# Patient Record
Sex: Female | Born: 1955 | Race: White | Hispanic: No | Marital: Married | Smoking: Never smoker
Health system: Southern US, Community
[De-identification: ages and names within clinical notes are randomized; demographics above are authoritative.]

## PROBLEM LIST (undated history)

## (undated) DIAGNOSIS — I10 Essential (primary) hypertension: Secondary | ICD-10-CM

## (undated) DIAGNOSIS — E039 Hypothyroidism, unspecified: Secondary | ICD-10-CM

## (undated) HISTORY — PX: LUMBAR LAMINECTOMY: SHX95

---

## 2002-07-11 ENCOUNTER — Encounter: Admission: RE | Admit: 2002-07-11 | Discharge: 2002-07-11 | Payer: Self-pay | Admitting: Internal Medicine

## 2002-07-11 ENCOUNTER — Encounter: Payer: Self-pay | Admitting: Internal Medicine

## 2002-07-30 ENCOUNTER — Encounter: Admission: RE | Admit: 2002-07-30 | Discharge: 2002-07-30 | Payer: Self-pay | Admitting: Internal Medicine

## 2002-07-30 ENCOUNTER — Encounter: Payer: Self-pay | Admitting: Internal Medicine

## 2003-08-20 ENCOUNTER — Other Ambulatory Visit: Admission: RE | Admit: 2003-08-20 | Discharge: 2003-08-20 | Payer: Self-pay | Admitting: Obstetrics and Gynecology

## 2003-09-24 ENCOUNTER — Encounter: Admission: RE | Admit: 2003-09-24 | Discharge: 2003-09-24 | Payer: Self-pay | Admitting: Obstetrics and Gynecology

## 2004-09-24 ENCOUNTER — Encounter: Admission: RE | Admit: 2004-09-24 | Discharge: 2004-09-24 | Payer: Self-pay | Admitting: Obstetrics and Gynecology

## 2006-05-01 ENCOUNTER — Encounter: Admission: RE | Admit: 2006-05-01 | Discharge: 2006-05-01 | Payer: Self-pay | Admitting: Obstetrics and Gynecology

## 2006-05-10 ENCOUNTER — Encounter: Admission: RE | Admit: 2006-05-10 | Discharge: 2006-05-10 | Payer: Self-pay | Admitting: Obstetrics and Gynecology

## 2006-06-13 ENCOUNTER — Ambulatory Visit: Payer: Self-pay | Admitting: Gastroenterology

## 2006-06-23 ENCOUNTER — Ambulatory Visit: Payer: Self-pay | Admitting: Gastroenterology

## 2006-06-23 ENCOUNTER — Encounter: Payer: Self-pay | Admitting: Gastroenterology

## 2007-05-07 ENCOUNTER — Encounter: Admission: RE | Admit: 2007-05-07 | Discharge: 2007-05-07 | Payer: Self-pay | Admitting: Obstetrics and Gynecology

## 2007-07-06 ENCOUNTER — Ambulatory Visit: Payer: Self-pay | Admitting: Internal Medicine

## 2007-07-06 DIAGNOSIS — E039 Hypothyroidism, unspecified: Secondary | ICD-10-CM | POA: Insufficient documentation

## 2007-07-06 DIAGNOSIS — N951 Menopausal and female climacteric states: Secondary | ICD-10-CM | POA: Insufficient documentation

## 2007-07-06 DIAGNOSIS — J309 Allergic rhinitis, unspecified: Secondary | ICD-10-CM | POA: Insufficient documentation

## 2007-07-06 DIAGNOSIS — R635 Abnormal weight gain: Secondary | ICD-10-CM | POA: Insufficient documentation

## 2007-07-06 DIAGNOSIS — I10 Essential (primary) hypertension: Secondary | ICD-10-CM | POA: Insufficient documentation

## 2007-07-13 ENCOUNTER — Ambulatory Visit: Payer: Self-pay | Admitting: Internal Medicine

## 2007-07-15 LAB — CONVERTED CEMR LAB
ALT: 23 U/L
AST: 24 U/L
Albumin: 4.2 g/dL
Alkaline Phosphatase: 44 U/L
BUN: 15 mg/dL
Basophils Absolute: 0 K/uL
Basophils Relative: 0.3 %
Bilirubin, Direct: 0.2 mg/dL
CO2: 28 meq/L
Calcium: 10.2 mg/dL
Chloride: 105 meq/L
Cholesterol: 218 mg/dL
Creatinine, Ser: 0.7 mg/dL
Direct LDL: 144.3 mg/dL
Eosinophils Absolute: 0.1 K/uL
Eosinophils Relative: 1.8 %
GFR calc Af Amer: 113 mL/min
GFR calc non Af Amer: 94 mL/min
Glucose, Bld: 92 mg/dL
HCT: 37.7 %
HDL: 42.5 mg/dL
Hemoglobin: 13.5 g/dL
Lymphocytes Relative: 35.3 %
MCHC: 35.7 g/dL
MCV: 93.6 fL
Monocytes Absolute: 0.5 K/uL
Monocytes Relative: 8.7 %
Neutro Abs: 3.5 K/uL
Neutrophils Relative %: 53.9 %
Platelets: 285 K/uL
Potassium: 4.4 meq/L
RBC: 4.03 M/uL
RDW: 11.5 %
Sodium: 141 meq/L
TSH: 5.71 u[IU]/mL — ABNORMAL HIGH
Total Bilirubin: 1.1 mg/dL
Total CHOL/HDL Ratio: 5.1
Total Protein: 7.1 g/dL
Triglycerides: 140 mg/dL
VLDL: 28 mg/dL
WBC: 6.3 10*3/microliter

## 2007-07-16 ENCOUNTER — Encounter: Payer: Self-pay | Admitting: Internal Medicine

## 2007-07-18 ENCOUNTER — Ambulatory Visit: Payer: Self-pay | Admitting: Internal Medicine

## 2007-07-18 DIAGNOSIS — E782 Mixed hyperlipidemia: Secondary | ICD-10-CM | POA: Insufficient documentation

## 2007-07-18 LAB — CONVERTED CEMR LAB
Cholesterol, target level: 200 mg/dL
HDL goal, serum: 40 mg/dL

## 2007-08-01 ENCOUNTER — Inpatient Hospital Stay (HOSPITAL_COMMUNITY): Admission: EM | Admit: 2007-08-01 | Discharge: 2007-08-04 | Payer: Self-pay | Admitting: Emergency Medicine

## 2007-10-31 ENCOUNTER — Ambulatory Visit: Payer: Self-pay | Admitting: Internal Medicine

## 2007-11-01 LAB — CONVERTED CEMR LAB
BUN: 12 mg/dL (ref 6–23)
Chloride: 100 meq/L (ref 96–112)
Creatinine, Ser: 0.7 mg/dL (ref 0.4–1.2)
Glucose, Bld: 87 mg/dL (ref 70–99)
Potassium: 4 meq/L (ref 3.5–5.1)
Sodium: 137 meq/L (ref 135–145)
TSH: 3.29 microintl units/mL (ref 0.35–5.50)

## 2007-11-20 ENCOUNTER — Encounter: Payer: Self-pay | Admitting: Internal Medicine

## 2007-11-21 ENCOUNTER — Ambulatory Visit: Payer: Self-pay | Admitting: Internal Medicine

## 2007-12-05 ENCOUNTER — Encounter: Admission: RE | Admit: 2007-12-05 | Discharge: 2008-02-12 | Payer: Self-pay | Admitting: Physician Assistant

## 2008-01-28 ENCOUNTER — Telehealth: Payer: Self-pay | Admitting: *Deleted

## 2008-02-23 ENCOUNTER — Ambulatory Visit: Payer: Self-pay | Admitting: Family Medicine

## 2008-02-23 DIAGNOSIS — S335XXA Sprain of ligaments of lumbar spine, initial encounter: Secondary | ICD-10-CM

## 2008-04-23 ENCOUNTER — Ambulatory Visit: Payer: Self-pay | Admitting: Internal Medicine

## 2008-04-23 LAB — CONVERTED CEMR LAB
ALT: 38 units/L — ABNORMAL HIGH (ref 0–35)
Albumin: 3.9 g/dL (ref 3.5–5.2)
Bilirubin, Direct: 0.1 mg/dL (ref 0.0–0.3)
Calcium: 9.6 mg/dL (ref 8.4–10.5)
Eosinophils Absolute: 0.1 10*3/uL (ref 0.0–0.7)
Eosinophils Relative: 1.6 % (ref 0.0–5.0)
GFR calc Af Amer: 97 mL/min
GFR calc non Af Amer: 80 mL/min
Hemoglobin: 13.6 g/dL (ref 12.0–15.0)
LDL Cholesterol: 97 mg/dL (ref 0–99)
Lymphocytes Relative: 34.8 % (ref 12.0–46.0)
MCHC: 35.5 g/dL (ref 30.0–36.0)
MCV: 96.1 fL (ref 78.0–100.0)
Monocytes Absolute: 0.5 10*3/uL (ref 0.1–1.0)
Monocytes Relative: 9.8 % (ref 3.0–12.0)
Neutrophils Relative %: 53.6 % (ref 43.0–77.0)
Nitrite: POSITIVE
RDW: 11.6 % (ref 11.5–14.6)
Specific Gravity, Urine: 1.015
TSH: 5.59 microintl units/mL — ABNORMAL HIGH (ref 0.35–5.50)
Total Bilirubin: 1 mg/dL (ref 0.3–1.2)
WBC Urine, dipstick: NEGATIVE
WBC: 5.3 10*3/uL (ref 4.5–10.5)
pH: 7

## 2008-05-05 ENCOUNTER — Ambulatory Visit: Payer: Self-pay | Admitting: Internal Medicine

## 2008-06-13 ENCOUNTER — Encounter: Admission: RE | Admit: 2008-06-13 | Discharge: 2008-06-13 | Payer: Self-pay | Admitting: Obstetrics and Gynecology

## 2008-07-21 ENCOUNTER — Ambulatory Visit: Payer: Self-pay | Admitting: Internal Medicine

## 2008-08-15 ENCOUNTER — Encounter: Payer: Self-pay | Admitting: Internal Medicine

## 2008-08-26 ENCOUNTER — Ambulatory Visit: Payer: Self-pay | Admitting: Internal Medicine
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
## 2009-03-19 ENCOUNTER — Telehealth: Payer: Self-pay | Admitting: Internal Medicine

## 2009-04-15 ENCOUNTER — Ambulatory Visit: Payer: Self-pay | Admitting: Internal Medicine

## 2009-04-15 DIAGNOSIS — IMO0002 Reserved for concepts with insufficient information to code with codable children: Secondary | ICD-10-CM

## 2009-04-24 ENCOUNTER — Ambulatory Visit: Payer: Self-pay | Admitting: Internal Medicine

## 2009-04-24 DIAGNOSIS — H9209 Otalgia, unspecified ear: Secondary | ICD-10-CM | POA: Insufficient documentation

## 2009-05-15 ENCOUNTER — Ambulatory Visit: Payer: Self-pay | Admitting: Internal Medicine

## 2009-05-26 ENCOUNTER — Encounter (INDEPENDENT_AMBULATORY_CARE_PROVIDER_SITE_OTHER): Payer: Self-pay | Admitting: *Deleted

## 2009-06-10 ENCOUNTER — Telehealth: Payer: Self-pay | Admitting: *Deleted

## 2009-06-17 ENCOUNTER — Encounter: Admission: RE | Admit: 2009-06-17 | Discharge: 2009-06-17 | Payer: Self-pay | Admitting: Obstetrics and Gynecology

## 2009-11-06 ENCOUNTER — Telehealth: Payer: Self-pay | Admitting: Gastroenterology

## 2009-11-11 ENCOUNTER — Encounter: Payer: Self-pay | Admitting: Gastroenterology

## 2009-12-02 ENCOUNTER — Encounter (INDEPENDENT_AMBULATORY_CARE_PROVIDER_SITE_OTHER): Payer: Self-pay | Admitting: *Deleted

## 2009-12-03 ENCOUNTER — Ambulatory Visit: Payer: Self-pay | Admitting: Gastroenterology

## 2009-12-07 ENCOUNTER — Ambulatory Visit: Payer: Self-pay | Admitting: Gastroenterology

## 2009-12-09 ENCOUNTER — Encounter: Payer: Self-pay | Admitting: Gastroenterology

## 2010-04-09 ENCOUNTER — Ambulatory Visit: Payer: Self-pay | Admitting: Internal Medicine

## 2010-04-09 DIAGNOSIS — M549 Dorsalgia, unspecified: Secondary | ICD-10-CM | POA: Insufficient documentation

## 2010-04-22 ENCOUNTER — Ambulatory Visit: Payer: Self-pay | Admitting: Internal Medicine

## 2010-04-22 LAB — CONVERTED CEMR LAB
Albumin: 4.1 g/dL (ref 3.5–5.2)
Basophils Relative: 0.3 % (ref 0.0–3.0)
Bilirubin, Direct: 0.1 mg/dL (ref 0.0–0.3)
CO2: 25 meq/L (ref 19–32)
Calcium: 9.6 mg/dL (ref 8.4–10.5)
Chloride: 108 meq/L (ref 96–112)
GFR calc non Af Amer: 110.52 mL/min (ref >60)
HDL: 41.5 mg/dL (ref >39.00)
Hemoglobin, Urine: NEGATIVE
Hemoglobin: 13.9 g/dL (ref 12.0–15.0)
Lymphocytes Relative: 40.6 % (ref 12.0–46.0)
Lymphs Abs: 2 10*3/uL (ref 0.7–4.0)
Neutro Abs: 2.2 10*3/uL (ref 1.4–7.7)
Platelets: 266 10*3/uL (ref 150.0–400.0)
Sodium: 141 meq/L (ref 135–145)
Specific Gravity, Urine: 1.025 (ref 1.000–1.030)
TSH: 0.32 microintl units/mL — ABNORMAL LOW (ref 0.35–5.50)
Total CHOL/HDL Ratio: 5
Total Protein, Urine: NEGATIVE mg/dL
Total Protein: 6.7 g/dL (ref 6.0–8.3)
Triglycerides: 220 mg/dL — ABNORMAL HIGH (ref 0.0–149.0)
Urine Glucose: NEGATIVE mg/dL
VLDL: 44 mg/dL — ABNORMAL HIGH (ref 0.0–40.0)
pH: 6 (ref 5.0–8.0)

## 2010-05-03 ENCOUNTER — Ambulatory Visit: Payer: Self-pay | Admitting: Internal Medicine

## 2010-05-03 DIAGNOSIS — L408 Other psoriasis: Secondary | ICD-10-CM

## 2010-07-21 ENCOUNTER — Telehealth: Payer: Self-pay | Admitting: *Deleted

## 2010-07-22 ENCOUNTER — Ambulatory Visit: Payer: Self-pay | Admitting: Internal Medicine

## 2010-07-23 ENCOUNTER — Encounter: Payer: Self-pay | Admitting: *Deleted

## 2010-08-09 ENCOUNTER — Encounter: Admission: RE | Admit: 2010-08-09 | Discharge: 2010-08-09 | Payer: Self-pay | Admitting: Obstetrics and Gynecology

## 2010-09-29 ENCOUNTER — Telehealth: Payer: Self-pay | Admitting: *Deleted

## 2010-11-14 LAB — CONVERTED CEMR LAB
ALT: 25 units/L (ref 0–35)
Albumin: 4.4 g/dL (ref 3.5–5.2)
Alkaline Phosphatase: 34 units/L — ABNORMAL LOW (ref 39–117)
BUN: 18 mg/dL (ref 6–23)
CO2: 28 meq/L (ref 19–32)
Calcium: 9.8 mg/dL (ref 8.4–10.5)
Chloride: 104 meq/L (ref 96–112)
Eosinophils Absolute: 0.1 10*3/uL (ref 0.0–0.7)
INR: 0.9 (ref 0.8–1.0)
Lymphocytes Relative: 35.5 % (ref 12.0–46.0)
MCV: 94.3 fL (ref 78.0–100.0)
Monocytes Absolute: 0.7 10*3/uL (ref 0.1–1.0)
Potassium: 3.5 meq/L (ref 3.5–5.1)
Prothrombin Time: 10.1 s — ABNORMAL LOW (ref 10.9–13.3)
RBC: 4.42 M/uL (ref 3.87–5.11)
RDW: 11.7 % (ref 11.5–14.6)

## 2010-11-16 NOTE — Letter (Signed)
Summary: Generic Letter  [facility]  [address] [place], [place] [address]   Phone: [phone]  Fax: [phone]    07/23/2010  Karie Chimera [address] [place], [place]  [address]  Dear Ms. Postlethwait,  Your TSH is normal. If you need a refill on this medication please give [place] a call. Otherwise, we will see you at your CPX in July 2012.         Sincerely,   Tor Netters, CMA (AAMA)

## 2010-11-16 NOTE — Procedures (Signed)
Summary: Colonoscopy  Patient: Christina Schroeder Note: All result statuses are Final unless otherwise noted.  Tests: (1) Colonoscopy (COL)   COL Colonoscopy           DONE     [facility]     [address]     [place], [place]  [address]           COLONOSCOPY PROCEDURE REPORT           PATIENT:  [name], [name]  MR#:  [id number]     BIRTHDATE:  January 02, 1956, 54 yrs. old  GENDER:  female           ENDOSCOPIST:  Judie Petit T. 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           MEDICATIONS:   Fentanyl 100 mcg IV, Versed 10 mg IV, Benadryl 50     mg IV           DESCRIPTION OF PROCEDURE:   After the risks benefits and     alternatives of the procedure were thoroughly explained, informed     consent was obtained.  Digital rectal exam was performed and     revealed no abnormalities.   The LB PCF-H180AL [id number] endoscope     was introduced through the anus and advanced to the cecum, which     was identified by both the appendix and ileocecal valve, without     limitations.  The quality of the prep was excellent, using     MoviPrep.  The instrument was then slowly withdrawn as the colon     was fully examined.     <<PROCEDUREIMAGES>>           FINDINGS:  Two polyps were found in the descending colon. They     were 4 mm in size. Polyps were snared without cautery. Retrieval     was successful. snare polyp  A normal appearing cecum, ileocecal     valve, and appendiceal orifice were identified. The ascending,     hepatic flexure, transverse, splenic flexure, sigmoid colon, and     rectum appeared unremarkable. Retroflexed views in the rectum     revealed no abnormalities. The time to cecum =  4  minutes. The     scope was then withdrawn (time =  9  min) from the patient and the     procedure completed.        COMPLICATIONS:  None           ENDOSCOPIC IMPRESSION:     1) 4 mm, two polyps in the descending colon           RECOMMENDATIONS:     1) Await pathology results     2) Repeat Colonoscopy in 3 years.           Venita Lick. Russella Dar, MD, [facility]           CC: Madelin Headings, MD           n.     Rosalie DoctorVenita Lick. Delma Villalva at 12/07/2009 04:24 PM           Karie Chimera, [id number]  Note: An exclamation mark Marland Kitchen)  indicates a result that was not dispersed into the flowsheet. Document Creation Date: 12/07/2009 4:24 PM _______________________________________________________________________  (1) Order result status: Final Collection or observation date-time: 12/07/2009 16:19 Requested date-time:  Receipt date-time:  Reported date-time:  Referring Physician:   Ordering Physician: Claudette Head [phone]) Specimen Source:  Source: Launa Grill Order Number: [id number] Lab site:

## 2010-11-16 NOTE — Miscellaneous (Signed)
Summary: [facility] Previsit/prep  Clinical Lists Changes  Medications: Added new medication of MOVIPREP 100 GM  SOLR (PEG-KCL-NACL-NASULF-NA ASC-C) As per prep instructions. - Signed Rx of MOVIPREP 100 GM  SOLR (PEG-KCL-NACL-NASULF-NA ASC-C) As per prep instructions.;  #1 x 0;  Signed;  Entered by: [name];  Authorized by: Meryl Dare MD [facility];  Method used: [facility]  [place] [address] [address], [place], [place]  [address], Ph: [phone] or [phone], Fax: [phone] Observations: Added new observation of ALLERGY REV: Done (12/03/2009 14:29)    Prescriptions: MOVIPREP 100 GM  SOLR (PEG-KCL-NACL-NASULF-NA ASC-C) As per prep instructions.  #1 x 0   Entered by:   [name]   Authorized by:   Meryl Dare MD [facility]   Signed by:   [name] on 12/03/2009   Method used:   [facility]  [place] [address] (retail)       [address]       [place], [place]  [address]       Ph: [phone] or [phone]       Fax: [phone]   RxID:   [id number]

## 2010-11-16 NOTE — Letter (Signed)
Summary: [facility] Instructions  [facility]  [address] [place], [place] [address]   Phone: [phone]  Fax: [phone]       Christina Schroeder    11-Aug-1956    MRN: [id number]        Procedure Day Dorna Bloom:  Duanne Limerick  12/07/09     Arrival Time:  2:30PM     Procedure Time:  3:30PM     Location of Procedure:                    _X _   [facility] (4th Floor)   PREPARATION FOR COLONOSCOPY WITH MOVIPREP   Starting 5 days prior to your procedure 12/02/09 do not eat nuts, seeds, popcorn, corn, beans, peas,  salads, or any raw vegetables.  Do not take any fiber supplements (e.g. Metamucil, Citrucel, and Benefiber).  THE DAY BEFORE YOUR PROCEDURE         DATE: SUNDAY  DAY: 12/06/09  1.  Drink clear liquids the entire day-NO SOLID FOOD  2.  Do not drink anything colored red or purple.  Avoid juices with pulp.  No orange juice.  3.  Drink at least 64 oz. (8 glasses) of fluid/clear liquids during the day to prevent dehydration and help the prep work efficiently.  CLEAR LIQUIDS INCLUDE: Water Jello Ice Popsicles Tea (sugar ok, no milk/cream) Powdered fruit flavored drinks Coffee (sugar ok, no milk/cream) Gatorade Juice: apple, white grape, white cranberry  Lemonade Clear bullion, consomm, broth Carbonated beverages (any kind) Strained chicken noodle soup Hard Candy                             4.  In the morning, mix first dose of MoviPrep solution:    Empty 1 Pouch A and 1 Pouch B into the disposable container    Add lukewarm drinking water to the top line of the container. Mix to dissolve    Refrigerate (mixed solution should be used within 24 hrs)  5.  Begin drinking the prep at 5:00 p.m. The MoviPrep container is divided by 4 marks.   Every 15 minutes drink the solution down to the next mark (approximately 8 oz) until the full liter is complete.   6.  Follow completed prep with 16 oz of clear liquid of your choice (Nothing red or purple).   Continue to drink clear liquids until bedtime.  7.  Before going to bed, mix second dose of MoviPrep solution:    Empty 1 Pouch A and 1 Pouch B into the disposable container    Add lukewarm drinking water to the top line of the container. Mix to dissolve    Refrigerate  THE DAY OF YOUR PROCEDURE      DATE: 12/07/09  DAY: MONDAY  Beginning at 10:30a.m. (5 hours before procedure):         1. Every 15 minutes, drink the solution down to the next mark (approx 8 oz) until the full liter is complete.  2. Follow completed prep with 16 oz. of clear liquid of your choice.    3. You may drink clear liquids until 1:30PM (2 HOURS BEFORE PROCEDURE).   MEDICATION INSTRUCTIONS  Unless otherwise instructed, you should take regular prescription medications with a small sip of water   as early as possible the morning of your procedure.          OTHER INSTRUCTIONS  You will need a responsible adult  at least 55 years of age to accompany you and drive you home.   This person must remain in the waiting room during your procedure.  Wear loose fitting clothing that is easily removed.  Leave jewelry and other valuables at home.  However, you may wish to bring a book to read or  an iPod/MP3 player to listen to music as you wait for your procedure to start.  Remove all body piercing jewelry and leave at home.  Total time from sign-in until discharge is approximately 2-3 hours.  You should go home directly after your procedure and rest.  You can resume normal activities the  day after your procedure.  The day of your procedure you should not:   Drive   Make legal decisions   Operate machinery   Drink alcohol   Return to work  You will receive specific instructions about eating, activities and medications before you leave.    The above instructions have been reviewed and explained to me by   Christina Almas RN  December 03, 2009 3:14 PM     I fully understand and can verbalize  these instructions _____________________________ Date _________

## 2010-11-16 NOTE — Letter (Signed)
Summary: Previsit letter  [facility]  [address] [place], [place] [address]   Phone: [phone]  Fax: [phone]       11/11/2009 MRN: [id number]  Christina Schroeder [address] [place], [place]  [address]  Dear Ms. Dimperio,  Welcome to the Gastroenterology Division at [facility].    You are scheduled to see a nurse for your pre-procedure visit on 12/03/09 (Thursday) at 2:30 pm on the 3rd floor at [facility], [address]  We ask that you try to arrive at our office 15 minutes prior to your appointment time to allow for check-in.  Your nurse visit will consist of discussing your medical and surgical history, your immediate family medical history, and your medications.    Please bring a complete list of all your medications or, if you prefer, bring the medication bottles and we will list them.  We will need to be aware of both prescribed and over the counter drugs.  We will need to know exact dosage information as well.  If you are on blood thinners (Coumadin, Plavix, Aggrenox, Ticlid, etc.) please call our office today/prior to your appointment, as we need to consult with your physician about holding your medication.   Please be prepared to read and sign documents such as consent forms, a financial agreement, and acknowledgement forms.  If necessary, and with your consent, a friend or relative is welcome to sit-in on the nurse visit with you.  Please bring your insurance card so that we may make a copy of it.  If your insurance requires a referral to see a specialist, please bring your referral form from your primary care physician.  No co-pay is required for this nurse visit.     If you cannot keep your appointment, please call [phone] to cancel or reschedule prior to your appointment date.  This allows [place] the opportunity to schedule an appointment for another patient in need of care.    Thank you for choosing [facility] for your  medical needs.  We appreciate the opportunity to care for you.  Please visit [place] at our website  to learn more about our practice.                     Sincerely.                                                                                                                   The Gastroenterology Division

## 2010-11-16 NOTE — Assessment & Plan Note (Signed)
Summary: CPX/PER DR Emberly Tomasso/CJR   Vital Signs:  Patient profile:   55 year old female Menstrual status:  irregular LMP:     03/11/2010 Height:      62.5 inches Weight:      153 pounds Pulse rate:   78 / minute BP sitting:   120 / 80  (right arm) Cuff size:   regular  Vitals Entered By: Romualdo Bolk, CMA (AAMA) (May 03, 2010 2:28 PM) CC: CPX-no pap- Pt has a gyn who does paps LMP (date): 03/11/2010 LMP - Character: iud Menarche (age onset years): 14    Enter LMP: 03/11/2010 Last PAP Result normal   History of Present Illness: Christina Schroeder is here for preventive visit .  Back is  40 % better  LIPID: does fairly well  but not recenlty and can do better  BP: controlled  Psoriasis left scalp  refil of med given in past  . No hand or other arthritis  See gyne  and on HRT  Thyroid no missed pills or change in generic/  Preventive Care Screening  Mammogram:    Date:  11/17/2009    Results:  normal   Pap Smear:    Date:  11/17/2009    Results:  normal   Prior Values:    Colonoscopy:  DONE (12/07/2009)    Last Tetanus Booster:  Historical (10/17/2005)   Preventive Screening-Counseling & Management  Alcohol-Tobacco     Alcohol drinks/day: 1     Alcohol type: wine     Smoking Status: never  Caffeine-Diet-Exercise     Caffeine use/day: 3 cups      Does Patient Exercise: yes     Type of exercise: cardio, wts, yoga,bike     Times/week: 7  Hep-HIV-STD-Contraception     Dental Visit-last 6 months yes     Sun Exposure-Excessive: no  Safety-Violence-Falls     Seat Belt Use: 100     Firearms in the Home: no firearms in the home     Smoke Detectors: yes  Comments: golf       Blood Transfusions:  no.    Current Medications (verified): 1)  Micardis Hct 40-12.5 Mg  Tabs (Telmisartan-Hctz) .Marland Kitchen.. 1 By Mouth   Once Daily 2)  Mobic 15 Mg  Tabs (Meloxicam) .Marland Kitchen.. 1 By Mouth Once Daily 3)  Levothroid 112 Mcg  Tabs (Levothyroxine Sodium) .Marland Kitchen.. 1 By Mouth Once  Daily 4)  Vivelle-Dot 0.05 Mg/24hr Pttw (Estradiol) 5)  Prometrium 100 Mg Caps (Progesterone Micronized) .Marland Kitchen.. 1 By Mouth Once Daily 6)  Cyclobenzaprine Hcl 10 Mg Tabs (Cyclobenzaprine Hcl) .Marland Kitchen.. 1 By Mouth  Hs   For Back Pain 7)  Lidoderm 5 % Ptch (Lidocaine) .... Apply Patch  To Affected Area   No More Than 12 Hours  For Back Pain  Allergies (verified): 1)  ! Sulfa  Past History:  Care Management: Orthopedics: Nitka  Gynecology:[facility] Neurosurgery:[facility] in [place]  Family History: Family History of Bowel diseaseCrohn's disease in her sister Family History of CAD Female 1st degree relative <50 father died age 19 of a heart attack and aspirated Family History Kidney disease  brother Family History Osteoporosis sister Family History Thyroid disease  mother mother has electrical heart rhythm problem and she is on blood thinners.    Psoriatic arthritis   father .   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  Dental Care w/in 6 mos.:  yes Sun Exposure-Excessive:  no Blood Transfusions:  no  Review of Systems  The patient denies anorexia, fever, weight loss, weight gain, decreased hearing, hoarseness, chest pain, syncope, dyspnea on exertion, peripheral edema, prolonged cough, headaches, abdominal pain, melena, hematochezia, severe indigestion/heartburn, hematuria, muscle weakness, transient blindness, difficulty walking, depression, and angioedema.   Physical Exam General Appearance: well developed, well nourished, no acute distress Eyes: conjunctiva and lids normal, PERRLA, EOMI, WNL Ears, Nose, Mouth, Throat: TM clear, nares clear, oral exam WNL Neck: supple, no lymphadenopathy, no thyromegaly, no JVD Respiratory: clear to auscultation and percussion, respiratory effort normal Cardiovascular: regular rate and rhythm, S1-S2, no murmur, rub or gallop,  no bruits, peripheral pulses normal and symmetric, no cyanosis, clubbing, edema or varicosities Chest: no scars, masses, tenderness; no asymmetry, skin changes, nipple discharge   Gastrointestinal: soft, non-tender; no hepatosplenomegaly, masses; active bowel sounds all quadrants,  Genitourinary: per gyne  Lymphatic: no cervical, axillary or inguinal adenopathy Musculoskeletal: gait normal, muscle tone and strength WNL, no joint swelling, effusions, discoloration, crepitus   slight stiffness to back but mobile and walks without limitation Skin: clear, good turgor, color WNL, no rashes, lesions, or ulcerations left scalpo with 3-4 cm patch of psoriasis  Neurologic: normal mental status, normal reflexes, normal strength, sensation, and motion Psychiatric: alert; oriented to person, place and time Other Exam:   see labs    elevated  TG    and borderline sugar elevation.       Impression & Recommendations:  Problem # 1:  HEALTH MAINTENANCE EXAM, ADULT (ICD-V70.0) Discussed nutrition,exercise,diet,healthy weight, vitamin D and calcium.       Problem # 2:  BACK PAIN (ICD-724.5) Assessment: Improved better but  still continues since surgery .  suspect mechanichal and may need supervised PT program     . if continues  see ortho and even consider  rhem with her  hx of ssoriasis  and family hx of psoriatic arthritis. Her updated medication list for this problem includes:    Mobic 15 Mg Tabs (Meloxicam) .Marland Kitchen... 1 by mouth once daily    Cyclobenzaprine Hcl 10 Mg Tabs (Cyclobenzaprine hcl) .Marland Kitchen... 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 1 by mouth once daily  Labs Reviewed: TSH: 0.32 (04/22/2010)     Chol: 199 (04/22/2010)   HDL: 41.50 (04/22/2010)   LDL: 97 (04/23/2008)   TG: 220.0 (04/22/2010)  Problem # 5:  HYPERTENSION (ICD-401.9)  Her updated medication list for this problem includes:    Micardis Hct 40-12.5 Mg Tabs (Telmisartan-hctz) .Marland Kitchen... 1 by mouth   once daily  BP today: 120/80 Prior BP: 120/80 (04/09/2010)  Prior 10 Yr Risk Heart Disease: 7 % (04/15/2009)  Labs Reviewed: K+: 4.1 (04/22/2010) Creat: : 0.6 (04/22/2010)   Chol: 199 (04/22/2010)   HDL: 41.50 (04/22/2010)   LDL: 97 (04/23/2008)   TG: 220.0 (04/22/2010)  Problem # 6:  HYPERLIPIDEMIA (ICD-272.2) counseled   with borderline bg  dyslipidemia      lifestyle intervention  Labs Reviewed: SGOT: 20 (04/22/2010)   SGPT: 20 (04/22/2010)  Lipid Goals: Chol Goal: 200 (07/18/2007)   HDL  Goal: 40 (07/18/2007)   LDL Goal: 160 (07/18/2007)   TG Goal: 150 (07/18/2007)  Prior 10 Yr Risk Heart Disease: 7 % (04/15/2009)   HDL:41.50 (04/22/2010), 38.7 (04/23/2008)  LDL:97 (04/23/2008), DEL (62/13/0865)  Chol:199 (04/22/2010), 173 (04/23/2008)  Trig:220.0 (04/22/2010), 187 (04/23/2008)  Problem # 7:  HRT  Assessment: Comment Only per gyne   Complete Medication List: 1)  Micardis Hct 40-12.5 Mg Tabs (Telmisartan-hctz) .Marland Kitchen.. 1 by mouth   once daily 2)  Mobic 15 Mg Tabs (Meloxicam) .Marland Kitchen.. 1 by mouth once daily 3)  Levothroid 112 Mcg Tabs (Levothyroxine sodium) .Marland Kitchen.. 1 by mouth once daily 4)  Vivelle-dot 0.05 Mg/24hr Pttw (Estradiol) 5)  Prometrium 100 Mg Caps (Progesterone micronized) .Marland Kitchen.. 1 by mouth once daily 6)  Cyclobenzaprine Hcl 10 Mg Tabs (Cyclobenzaprine hcl) .Marland Kitchen.. 1 by mouth  hs   for back pain 7)  Lidoderm 5 % Ptch (Lidocaine) .... Apply patch  to affected area   no more than 12 hours  for back pain  Patient Instructions: 1)  call with name of med for the psoriasis.   2)  See ortho about your back  3)  limit   sugars   and   creamy foods .  mediterranean diet is healthiest. 4)  TSh in 3 months . Dosing of med and    follow up depending on results   5)  Otherwise  yearly check up with labs   Preventive Care Screening  Mammogram:    Date:  11/17/2009    Results:  normal   Pap Smear:    Date:  11/17/2009    Results:  normal   Prior Values:    Colonoscopy:  DONE (12/07/2009)    Last Tetanus Booster:  Historical (10/17/2005)

## 2010-11-16 NOTE — Progress Notes (Signed)
Summary: Schedule Colonoscopy  Phone Note Outgoing Call   Call placed by: Hortense Ramal CMA [facility] Dull),  November 06, 2009 12:43 PM Call placed to: Patient Summary of Call: I have attempted to contact patient to advise her that it is time for her recall colonoscopy due to age and due to her previous history of adenomatous colonic polyps. Her home phone is not accepting calls at this time. 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  Initial call taken by: Hortense Ramal CMA [facility] Dull),  November 06, 2009 12:44 PM  Follow-up for Phone Call        Patient called back and states that hse recall is not until 5 yrs after her last one in '07. Does she need a 3 yr recall or 5 yr? Follow-up by: Tawni Levy,  November 06, 2009 1:25 PM  Additional Follow-up for Phone Call Additional follow up Details #1::        Per Dr Russella Dar, recall is now (actually 9/10) based on the pathology report from 9/07. 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 I have called patient to advise, however I had to leave a message on her voicemail to call back. Hortense Ramal CMA [facility] Dull)  November 11, 2009 8:23 AM     Additional Follow-up for Phone Call Additional follow up Details #2::    Patient called back and has scheduled her previsit and colonoscopy for February 2011. Follow-up by: Hortense Ramal CMA [facility] Dull),  November 11, 2009 11:25 AM

## 2010-11-16 NOTE — Progress Notes (Signed)
Summary: new rx  Phone Note Refill Request Message from:  Patient  Refills Requested: Medication #1:  LEVOTHROID 112 MCG  TABS 1 by mouth once daily pt needs new rx fax to [facility] [phone] #90 with 3 refills  Initial call taken by: Heron Sabins,  July 21, 2010 2:43 PM  Follow-up for Phone Call        LMTOCB- Pt needs a tsh done. Follow-up by: Romualdo Bolk, CMA [facility] Dull),  July 21, 2010 3:49 PM  Additional Follow-up for Phone Call Additional follow up Details #1::        Pt aware and lab appt scheduled. Rx sent to [facility]. Additional Follow-up by: Romualdo Bolk, CMA (AAMA),  July 21, 2010 4:56 PM    Prescriptions: LEVOTHROID 112 MCG  TABS (LEVOTHYROXINE SODIUM) 1 by mouth once daily  #90 Tablet x 0   Entered by:   Romualdo Bolk, CMA (AAMA)   Authorized by:   Madelin Headings MD   Signed by:   Romualdo Bolk, CMA (AAMA) on 07/21/2010   Method used:   Faxed to ...       [facility]* (retail)             ,          Ph: [phone]       Fax: [phone]   RxID:   [id number]
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
## 2010-11-16 NOTE — Letter (Signed)
Summary: Patient Notice- Colon Biospy Results  [facility]  [address] [place], [place] [address]   Phone: [phone]  Fax: [phone]        December 09, 2009 MRN: [id number]    Christina Schroeder [address] [place], [place]  [address]    Dear Ms. Mcgillis,  I am pleased to inform you that the biopsies taken during your recent colonoscopy did not show any evidence of cancer upon pathologic examination. The biopsies showed normal colonic lining.  Continue with the treatment plan as outlined on the day of your      exam.  You should have a repeat colonoscopy examination in 3 years.  Please call us if you are having persistent problems or have questions about your condition that have not been fully answered at this time.  Sincerely,  Meryl Dare MD [facility]  This letter has been electronically signed by your physician.  Appended Document: Patient Notice- Colon Biospy Results Letter mailed 2.25.11

## 2010-11-18 NOTE — Progress Notes (Signed)
Summary: refill on diflorasone  Phone Note Call from Patient Call back at Work Phone [phone]   Caller: Patient Summary of Call: Pt wants a rx sent to [facility] on Diflorasone 0.5% cream. Initial call taken by: Romualdo Bolk, CMA (AAMA),  September 29, 2010 1:56 PM  Follow-up for Phone Call        this is not on her med list .   Whomever rxed this initially should do the refill . please advise  Follow-up by: Madelin Headings MD,  September 29, 2010 5:11 PM  Additional Follow-up for Phone Call Additional follow up Details #1::        Pt states that last refill was 10 years in Pn. 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 She has 2 spots behind her ear that she uses this for two times a week. Additional Follow-up by: Romualdo Bolk, CMA [facility] Dull),  September 30, 2010 11:39 AM    Additional Follow-up for Phone Call Additional follow up Details #2::    ok 15 gram  can use two times a day no more than 2 weeks at a time.  refill x 1   Follow-up by: Madelin Headings MD,  October 05, 2010 1:13 PM  Additional Follow-up for Phone Call Additional follow up Details #3:: Details for Additional Follow-up Action Taken: Rx sent to pharmacy Additional Follow-up by: Romualdo Bolk, CMA (AAMA),  October 05, 2010 1:45 PM  New/Updated Medications: DIFLORASONE DIACETATE 0.05 % CREA (DIFLORASONE DIACETATE) use two times a day no more than 2 weeks at a time Prescriptions: DIFLORASONE DIACETATE 0.05 % CREA (DIFLORASONE DIACETATE) use two times a day no more than 2 weeks at a time  #15 x 1   Entered by:   Romualdo Bolk, CMA (AAMA)   Authorized by:   Madelin Headings MD   Signed by:   Romualdo Bolk, CMA (AAMA) on 10/05/2010   Method used:   [facility]        [facility]  [place] [address] [facility]ail[facility]       [address]       [place], [place]  [address]       Ph: [phone] or [phone]       Fax: [phone]   RxID:   [id number]

## 2010-11-19 ENCOUNTER — Other Ambulatory Visit: Payer: Self-pay | Admitting: Internal Medicine
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
## 2011-04-25 ENCOUNTER — Other Ambulatory Visit: Payer: Self-pay | Admitting: Internal Medicine

## 2011-04-25 NOTE — Telephone Encounter (Signed)
Okay x 1 only. Pt needs to schedule a cpx before next refill. I put this in the directions field.

## 2011-07-27 LAB — ABO/RH: ABO/RH(D): A POS

## 2011-07-27 LAB — I-STAT 8, (EC8 V) (CONVERTED LAB)
Acid-base deficit: 1
BUN: 15
Bicarbonate: 22.1
Chloride: 104
HCT: 43
Hemoglobin: 14.6
Operator id: 279831
Sodium: 137

## 2011-07-27 LAB — URINALYSIS, ROUTINE W REFLEX MICROSCOPIC
Ketones, ur: NEGATIVE
Nitrite: NEGATIVE
Specific Gravity, Urine: 1.029
Urobilinogen, UA: 0.2
pH: 6

## 2011-07-27 LAB — TYPE AND SCREEN
ABO/RH(D): A POS
Antibody Screen: NEGATIVE

## 2011-07-27 LAB — CBC
HCT: 39.1
Hemoglobin: 13.7
MCHC: 35.1
MCV: 92.1
Platelets: 358
RDW: 12

## 2011-07-27 LAB — HEMOGLOBIN AND HEMATOCRIT, BLOOD: Hemoglobin: 11.9 — ABNORMAL LOW

## 2011-07-27 LAB — URINE MICROSCOPIC-ADD ON

## 2011-07-27 LAB — PROTIME-INR
INR: 1
INR: 1.5

## 2011-07-27 LAB — POCT I-STAT CREATININE: Creatinine, Ser: 0.9

## 2012-11-02 ENCOUNTER — Encounter: Payer: Self-pay | Admitting: Gastroenterology

## 2012-11-14 ENCOUNTER — Telehealth: Payer: Self-pay | Admitting: Gastroenterology

## 2012-11-14 NOTE — Telephone Encounter (Signed)
MOVED [facility]

## 2012-12-12 DIAGNOSIS — Z90710 Acquired absence of both cervix and uterus: Secondary | ICD-10-CM | POA: Insufficient documentation

## 2013-06-18 DIAGNOSIS — Z8679 Personal history of other diseases of the circulatory system: Secondary | ICD-10-CM | POA: Insufficient documentation

## 2013-06-19 DIAGNOSIS — G5600 Carpal tunnel syndrome, unspecified upper limb: Secondary | ICD-10-CM | POA: Insufficient documentation

## 2013-10-30 DIAGNOSIS — M25539 Pain in unspecified wrist: Secondary | ICD-10-CM | POA: Insufficient documentation

## 2015-06-16 ENCOUNTER — Encounter: Payer: Self-pay | Admitting: Gastroenterology

## 2015-08-19 DIAGNOSIS — M47817 Spondylosis without myelopathy or radiculopathy, lumbosacral region: Secondary | ICD-10-CM | POA: Insufficient documentation

## 2015-10-18 HISTORY — PX: ABDOMINAL HYSTERECTOMY: SHX81

## 2019-04-04 ENCOUNTER — Telehealth: Payer: Self-pay | Admitting: Specialist

## 2019-04-04 NOTE — Telephone Encounter (Signed)
Patient called advised she is a former patient os Dr Louanne Skye and would like to speak with his assistant before she schedules an appointment to see Dr Louanne Skye concerning the problems she is having now. Patient said there is a problem with the pins he put in her left tibia. The number to contact patient is [phone]

## 2019-04-08 NOTE — Telephone Encounter (Signed)
I called and lmom for pt to call me back 

## 2019-04-09 ENCOUNTER — Telehealth: Payer: Self-pay | Admitting: Specialist

## 2019-04-09 NOTE — Telephone Encounter (Signed)
Pt returning Christy's call

## 2019-04-09 NOTE — Telephone Encounter (Signed)
I called her back, she states that she lives in [place], and that she has been seeing their local Ortho Drs there, they seem to think that the hardware in her leg is effecting the soft tissues in her ankle.  She states that they want to remove it however she would like either Dr. Louanne Skye or Dr. Sharol Given to look at the info and see if they will see her for Hardware removal.  She is going to mail me the info on this and I told her that I would let Dr. Louanne Skye look at it and see he or Dr. Sharol Given would do it and we would et her know.

## 2019-04-17 ENCOUNTER — Telehealth: Payer: Self-pay | Admitting: Orthopedic Surgery

## 2019-04-17 NOTE — Telephone Encounter (Signed)
Tyerra called to confirm that we received her MRI and X-ray for Dr. Louanne Skye to review.

## 2019-04-17 NOTE — Telephone Encounter (Signed)
I called and advised that we did rec'e the envelop and that Dr. Louanne Skye has been out of town so he has not been able to look at it.

## 2019-04-30 ENCOUNTER — Telehealth: Payer: Self-pay | Admitting: Specialist

## 2019-04-30 NOTE — Telephone Encounter (Signed)
Patient called asked if she can be referred to Dr Ernestina Patches for an appointment 05/23/2019? Patient said she lives 4 hours away. The number to contact patient is [phone]

## 2019-04-30 NOTE — Telephone Encounter (Signed)
I called and spoke with patient, NKI, same pain that she has been having prior. --Her info is in [facility].

## 2019-05-02 NOTE — Telephone Encounter (Signed)
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 [place] for same injection if no trauma, new issues etc. Seeing Nitka next day if we see her day before.

## 2019-05-23 ENCOUNTER — Ambulatory Visit: Payer: Self-pay

## 2019-05-23 ENCOUNTER — Encounter: Payer: Self-pay | Admitting: Physical Medicine and Rehabilitation

## 2019-05-23 ENCOUNTER — Ambulatory Visit (INDEPENDENT_AMBULATORY_CARE_PROVIDER_SITE_OTHER): Payer: BLUE CROSS/BLUE SHIELD | Admitting: Physical Medicine and Rehabilitation

## 2019-05-23 VITALS — BP 166/97 | HR 62 | Ht 63.0 in | Wt 149.2 lb

## 2019-05-23 DIAGNOSIS — M5416 Radiculopathy, lumbar region: Secondary | ICD-10-CM | POA: Diagnosis not present

## 2019-05-23 DIAGNOSIS — M961 Postlaminectomy syndrome, not elsewhere classified: Secondary | ICD-10-CM

## 2019-05-23 DIAGNOSIS — M25572 Pain in left ankle and joints of left foot: Secondary | ICD-10-CM

## 2019-05-23 DIAGNOSIS — M47816 Spondylosis without myelopathy or radiculopathy, lumbar region: Secondary | ICD-10-CM

## 2019-05-23 MED ORDER — BETAMETHASONE SOD PHOS & ACET 6 (3-3) MG/ML IJ SUSP
12.0000 mg | Freq: Once | INTRAMUSCULAR | Status: AC
  Administered 2019-05-23: 12 mg
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
## 2019-05-23 NOTE — Progress Notes (Signed)
 .  Numeric Pain Rating Scale and Functional Assessment Average Pain 7 Pain Right Now 9 My pain is constant, dull and aching Pain is worse with: walking, sitting and standing Pain improves with: heat/ice   In the last MONTH (on 0-10 scale) has pain interfered with the following?  1. General activity like being  able to carry out your everyday physical activities such as walking, climbing stairs, carrying groceries, or moving a chair?  Rating(8)  2. Relation with others like being able to carry out your usual social activities and roles such as  activities at home, at work and in your community. Rating(8)  3. Enjoyment of life such that you have  been bothered by emotional problems such as feeling anxious, depressed or irritable?  Rating(5)

## 2019-05-23 NOTE — Procedures (Signed)
Lumbosacral Transforaminal Epidural Steroid Injection - Sub-Pedicular Approach with Fluoroscopic Guidance  Patient: Christina Schroeder      Date of Birth: 06/21/1956 MRN: [id number] PCP: Burnis Medin, MD      Visit Date: 05/23/2019   Universal Protocol:    Date/Time: 05/23/2019  Consent Given By: the patient  Position: PRONE  Additional Comments: Vital signs were monitored before and after the procedure. Patient was prepped and draped in the usual sterile fashion. The correct patient, procedure, and site was verified.   Injection Procedure Details:  Procedure Site One Meds Administered:  Meds ordered this encounter  Medications  . betamethasone acetate-betamethasone sodium phosphate (CELESTONE) injection 12 mg    Laterality: Right  Location/Site:  L4-L5  Needle size: 22 G  Needle type: Spinal  Needle Placement: Transforaminal  Findings:    -Comments: Excellent flow of contrast along the nerve and into the epidural space.  Patient has a transitional L5 segment with the left side being more sacralized.  Procedure Details: After squaring off the end-plates to get a true AP view, the C-arm was positioned so that an oblique view of the foramen as noted above was visualized. The target area is just inferior to the "nose of the scotty dog" or sub pedicular. The soft tissues overlying this structure were infiltrated with 2-3 ml. of 1% Lidocaine without Epinephrine.  The spinal needle was inserted toward the target using a "trajectory" view along the fluoroscope beam.  Under AP and lateral visualization, the needle was advanced so it did not puncture dura and was located close the 6 O'Clock position of the pedical in AP tracterory. Biplanar projections were used to confirm position. Aspiration was confirmed to be negative for CSF and/or blood. A 1-2 ml. volume of Isovue-250 was injected and flow of contrast was noted at each level. Radiographs were obtained for documentation  purposes.   After attaining the desired flow of contrast documented above, a 0.5 to 1.0 ml test dose of 0.25% Marcaine was injected into each respective transforaminal space.  The patient was observed for 90 seconds post injection.  After no sensory deficits were reported, and normal lower extremity motor function was noted,   the above injectate was administered so that equal amounts of the injectate were placed at each foramen (level) into the transforaminal epidural space.   Additional Comments:  The patient tolerated the procedure well Dressing: 2 x 2 sterile gauze and Band-Aid    Post-procedure details: Patient was observed during the procedure. Post-procedure instructions were reviewed.  Patient left the clinic in stable condition.

## 2019-05-24 ENCOUNTER — Other Ambulatory Visit: Payer: Self-pay

## 2019-05-24 ENCOUNTER — Ambulatory Visit (INDEPENDENT_AMBULATORY_CARE_PROVIDER_SITE_OTHER): Payer: BLUE CROSS/BLUE SHIELD | Admitting: Specialist

## 2019-05-24 ENCOUNTER — Encounter: Payer: Self-pay | Admitting: Specialist

## 2019-05-24 VITALS — BP 154/82 | HR 80 | Ht 63.0 in | Wt 149.2 lb

## 2019-05-24 DIAGNOSIS — T8484XA Pain due to internal orthopedic prosthetic devices, implants and grafts, initial encounter: Secondary | ICD-10-CM

## 2019-05-24 DIAGNOSIS — M51369 Other intervertebral disc degeneration, lumbar region without mention of lumbar back pain or lower extremity pain: Secondary | ICD-10-CM

## 2019-05-24 DIAGNOSIS — M6788 Other specified disorders of synovium and tendon, other site: Secondary | ICD-10-CM | POA: Diagnosis not present

## 2019-05-24 DIAGNOSIS — M5136 Other intervertebral disc degeneration, lumbar region: Secondary | ICD-10-CM | POA: Diagnosis not present

## 2019-05-24 NOTE — Patient Instructions (Signed)
Plan: Removal of 3 left distal interlocking screws from the left tibia. Following surgery will need to wear a CAM walking brace for 4-6 weeks. Risks of surgery include infection 1 in 300, low risk of bleeding, risks to superficial sensory nerves is present with about a 5% chance of numbness or tingling.  Kandice Hams is my scheduler and should contact you in the next week to schedule for surgical removal of the hardware.
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
## 2019-06-11 ENCOUNTER — Telehealth: Payer: Self-pay | Admitting: Specialist

## 2019-06-11 NOTE — Telephone Encounter (Signed)
Patient called stated would like a call back.  [phone]

## 2019-06-12 ENCOUNTER — Telehealth: Payer: Self-pay | Admitting: Specialist

## 2019-06-12 NOTE — Telephone Encounter (Signed)
Can you please call her once you ave a second?

## 2019-06-12 NOTE — Telephone Encounter (Signed)
Already called this afternoon.

## 2019-06-12 NOTE — Telephone Encounter (Signed)
I called and spoke with patient.

## 2019-06-12 NOTE — Telephone Encounter (Signed)
Patient called [phone] called and stated wanted to get her surgery scheduled. Asked if she wanted to speak to scheduler she said no she has left 3 messages.  Please call patient to advise

## 2019-06-21 ENCOUNTER — Other Ambulatory Visit: Payer: Self-pay

## 2019-06-21 ENCOUNTER — Encounter (HOSPITAL_BASED_OUTPATIENT_CLINIC_OR_DEPARTMENT_OTHER): Payer: Self-pay

## 2019-06-28 ENCOUNTER — Other Ambulatory Visit (HOSPITAL_COMMUNITY)
Admission: RE | Admit: 2019-06-28 | Discharge: 2019-06-28 | Disposition: A | Discharge status: Home / Self Care | Payer: BLUE CROSS/BLUE SHIELD | Source: Ambulatory Visit | Attending: Specialist | Admitting: Specialist

## 2019-06-28 ENCOUNTER — Other Ambulatory Visit: Payer: Self-pay

## 2019-06-28 ENCOUNTER — Encounter (HOSPITAL_BASED_OUTPATIENT_CLINIC_OR_DEPARTMENT_OTHER)
Admission: RE | Admit: 2019-06-28 | Discharge: 2019-06-28 | Disposition: A | Discharge status: Home / Self Care | Payer: BLUE CROSS/BLUE SHIELD | Source: Ambulatory Visit | Attending: Specialist | Admitting: Specialist

## 2019-06-28 DIAGNOSIS — Z20828 Contact with and (suspected) exposure to other viral communicable diseases: Secondary | ICD-10-CM | POA: Diagnosis not present

## 2019-06-28 DIAGNOSIS — Z01812 Encounter for preprocedural laboratory examination: Secondary | ICD-10-CM | POA: Diagnosis present

## 2019-06-28 LAB — BASIC METABOLIC PANEL
Anion gap: 11 (ref 5–15)
BUN: 13 mg/dL (ref 8–23)
CO2: 27 mmol/L (ref 22–32)
Calcium: 9.3 mg/dL (ref 8.9–10.3)
Chloride: 101 mmol/L (ref 98–111)
Creatinine, Ser: 0.79 mg/dL (ref 0.44–1.00)
GFR calc Af Amer: >60 mL/min (ref >60)
GFR calc non Af Amer: >60 mL/min (ref >60)
Glucose, Bld: 93 mg/dL (ref 70–99)
Potassium: 3.3 mmol/L — ABNORMAL LOW (ref 3.5–5.1)
Sodium: 139 mmol/L (ref 135–145)

## 2019-06-28 NOTE — Progress Notes (Signed)
Labs reviewed by Dr. Singer, will proceed with surgery as scheduled.  

## 2019-06-29 LAB — NOVEL CORONAVIRUS, NAA (HOSP ORDER, SEND-OUT TO REF LAB; TAT 18-24 HRS): SARS-CoV-2, NAA: NOT DETECTED

## 2019-07-02 ENCOUNTER — Ambulatory Visit (HOSPITAL_BASED_OUTPATIENT_CLINIC_OR_DEPARTMENT_OTHER): Payer: BLUE CROSS/BLUE SHIELD | Admitting: Anesthesiology

## 2019-07-02 ENCOUNTER — Encounter (HOSPITAL_BASED_OUTPATIENT_CLINIC_OR_DEPARTMENT_OTHER)
Admission: RE | Disposition: A | Discharge status: Home / Self Care | Payer: Self-pay | Source: Home / Self Care | Attending: Specialist

## 2019-07-02 ENCOUNTER — Ambulatory Visit (HOSPITAL_BASED_OUTPATIENT_CLINIC_OR_DEPARTMENT_OTHER)
Admission: RE | Admit: 2019-07-02 | Discharge: 2019-07-02 | Disposition: A | Discharge status: Home / Self Care | Payer: BLUE CROSS/BLUE SHIELD | Attending: Specialist | Admitting: Specialist

## 2019-07-02 ENCOUNTER — Other Ambulatory Visit: Payer: Self-pay

## 2019-07-02 ENCOUNTER — Encounter (HOSPITAL_BASED_OUTPATIENT_CLINIC_OR_DEPARTMENT_OTHER): Payer: Self-pay | Admitting: Anesthesiology

## 2019-07-02 DIAGNOSIS — X58XXXA Exposure to other specified factors, initial encounter: Secondary | ICD-10-CM | POA: Diagnosis not present

## 2019-07-02 DIAGNOSIS — Z79899 Other long term (current) drug therapy: Secondary | ICD-10-CM | POA: Diagnosis not present

## 2019-07-02 DIAGNOSIS — E039 Hypothyroidism, unspecified: Secondary | ICD-10-CM | POA: Insufficient documentation

## 2019-07-02 DIAGNOSIS — I10 Essential (primary) hypertension: Secondary | ICD-10-CM | POA: Diagnosis not present

## 2019-07-02 DIAGNOSIS — Y831 Surgical operation with implant of artificial internal device as the cause of abnormal reaction of the patient, or of later complication, without mention of misadventure at the time of the procedure: Secondary | ICD-10-CM | POA: Diagnosis not present

## 2019-07-02 DIAGNOSIS — Z79890 Hormone replacement therapy (postmenopausal): Secondary | ICD-10-CM | POA: Diagnosis not present

## 2019-07-02 DIAGNOSIS — T8484XA Pain due to internal orthopedic prosthetic devices, implants and grafts, initial encounter: Secondary | ICD-10-CM | POA: Diagnosis present

## 2019-07-02 DIAGNOSIS — M1990 Unspecified osteoarthritis, unspecified site: Secondary | ICD-10-CM | POA: Diagnosis not present

## 2019-07-02 DIAGNOSIS — Z882 Allergy status to sulfonamides status: Secondary | ICD-10-CM | POA: Insufficient documentation

## 2019-07-02 HISTORY — PX: HARDWARE REMOVAL: SHX979

## 2019-07-02 HISTORY — DX: Essential (primary) hypertension: I10

## 2019-07-02 HISTORY — DX: Hypothyroidism, unspecified: E03.9

## 2019-07-02 LAB — COMPREHENSIVE METABOLIC PANEL
ALT: 20 U/L (ref 0–44)
AST: 18 U/L (ref 15–41)
Albumin: 4.2 g/dL (ref 3.5–5.0)
Alkaline Phosphatase: 45 U/L (ref 38–126)
Anion gap: 10 (ref 5–15)
BUN: 17 mg/dL (ref 8–23)
CO2: 25 mmol/L (ref 22–32)
Calcium: 9.6 mg/dL (ref 8.9–10.3)
Chloride: 104 mmol/L (ref 98–111)
Creatinine, Ser: 0.75 mg/dL (ref 0.44–1.00)
GFR calc Af Amer: >60 mL/min (ref >60)
GFR calc non Af Amer: >60 mL/min (ref >60)
Glucose, Bld: 106 mg/dL — ABNORMAL HIGH (ref 70–99)
Potassium: 4.3 mmol/L (ref 3.5–5.1)
Sodium: 139 mmol/L (ref 135–145)
Total Bilirubin: 0.9 mg/dL (ref 0.3–1.2)
Total Protein: 7 g/dL (ref 6.5–8.1)

## 2019-07-02 LAB — CBC
HCT: 42.2 % (ref 36.0–46.0)
Hemoglobin: 14.4 g/dL (ref 12.0–15.0)
MCH: 31.2 pg (ref 26.0–34.0)
MCHC: 34.1 g/dL (ref 30.0–36.0)
MCV: 91.3 fL (ref 80.0–100.0)
Platelets: 264 10*3/uL (ref 150–400)
RBC: 4.62 MIL/uL (ref 3.87–5.11)
RDW: 11.9 % (ref 11.5–15.5)
WBC: 4.8 10*3/uL (ref 4.0–10.5)
nRBC: 0 % (ref 0.0–0.2)

## 2019-07-02 SURGERY — REMOVAL, HARDWARE
Anesthesia: General | Site: Leg Lower | Laterality: Left

## 2019-07-02 MED ORDER — FENTANYL CITRATE (PF) 100 MCG/2ML IJ SOLN: 50.0000 ug | INTRAMUSCULAR | Status: DC | PRN

## 2019-07-02 MED ORDER — DEXAMETHASONE SODIUM PHOSPHATE 10 MG/ML IJ SOLN
INTRAMUSCULAR | Status: AC
  Filled 2019-07-02: qty 1

## 2019-07-02 MED ORDER — KETOROLAC TROMETHAMINE 30 MG/ML IJ SOLN
INTRAMUSCULAR | Status: AC
  Filled 2019-07-02: qty 1

## 2019-07-02 MED ORDER — DEXAMETHASONE SODIUM PHOSPHATE 4 MG/ML IJ SOLN
INTRAMUSCULAR | Status: DC | PRN
  Administered 2019-07-02: 10 mg via INTRAVENOUS

## 2019-07-02 MED ORDER — PROMETHAZINE HCL 25 MG/ML IJ SOLN
INTRAMUSCULAR | Status: AC
  Filled 2019-07-02: qty 1

## 2019-07-02 MED ORDER — BUPIVACAINE HCL (PF) 0.5 % IJ SOLN
INTRAMUSCULAR | Status: AC
  Filled 2019-07-02: qty 30

## 2019-07-02 MED ORDER — OXYCODONE HCL 5 MG PO TABS
5.0000 mg | ORAL_TABLET | Freq: Once | ORAL | Status: AC | PRN
  Administered 2019-07-02: 5 mg via ORAL

## 2019-07-02 MED ORDER — FENTANYL CITRATE (PF) 100 MCG/2ML IJ SOLN
INTRAMUSCULAR | Status: AC
  Filled 2019-07-02: qty 2

## 2019-07-02 MED ORDER — ASPIRIN EC 325 MG PO TBEC: 325.0000 mg | DELAYED_RELEASE_TABLET | Freq: Every day | ORAL | 0 refills | Status: DC

## 2019-07-02 MED ORDER — MIDAZOLAM HCL 2 MG/2ML IJ SOLN
INTRAMUSCULAR | Status: AC
  Filled 2019-07-02: qty 2

## 2019-07-02 MED ORDER — LIDOCAINE HCL (CARDIAC) PF 100 MG/5ML IV SOSY
PREFILLED_SYRINGE | INTRAVENOUS | Status: DC | PRN
  Administered 2019-07-02: 100 mg via INTRAVENOUS

## 2019-07-02 MED ORDER — HYDROMORPHONE HCL 1 MG/ML IJ SOLN
0.2500 mg | INTRAMUSCULAR | Status: DC | PRN
  Administered 2019-07-02 (×2): 0.5 mg via INTRAVENOUS

## 2019-07-02 MED ORDER — ONDANSETRON HCL 4 MG/2ML IJ SOLN
INTRAMUSCULAR | Status: AC
  Filled 2019-07-02: qty 2

## 2019-07-02 MED ORDER — OXYCODONE HCL 5 MG PO TABS
ORAL_TABLET | ORAL | Status: AC
  Filled 2019-07-02: qty 1

## 2019-07-02 MED ORDER — CEFAZOLIN SODIUM-DEXTROSE 2-4 GM/100ML-% IV SOLN
2.0000 g | INTRAVENOUS | Status: AC
  Administered 2019-07-02: 2 g via INTRAVENOUS

## 2019-07-02 MED ORDER — CEFAZOLIN SODIUM-DEXTROSE 2-4 GM/100ML-% IV SOLN
INTRAVENOUS | Status: AC
  Filled 2019-07-02: qty 100

## 2019-07-02 MED ORDER — FENTANYL CITRATE (PF) 100 MCG/2ML IJ SOLN
INTRAMUSCULAR | Status: DC | PRN
  Administered 2019-07-02: 25 ug via INTRAVENOUS
  Administered 2019-07-02: 100 ug via INTRAVENOUS

## 2019-07-02 MED ORDER — PROMETHAZINE HCL 25 MG/ML IJ SOLN
6.2500 mg | INTRAMUSCULAR | Status: DC | PRN
  Administered 2019-07-02: 6.25 mg via INTRAVENOUS

## 2019-07-02 MED ORDER — HYDROMORPHONE HCL 1 MG/ML IJ SOLN
INTRAMUSCULAR | Status: AC
  Filled 2019-07-02: qty 0.5

## 2019-07-02 MED ORDER — BUPIVACAINE HCL (PF) 0.5 % IJ SOLN: INTRAMUSCULAR | Status: DC | PRN

## 2019-07-02 MED ORDER — PROPOFOL 10 MG/ML IV BOLUS
INTRAVENOUS | Status: DC | PRN
  Administered 2019-07-02: 150 mg via INTRAVENOUS
  Administered 2019-07-02: 30 mg via INTRAVENOUS

## 2019-07-02 MED ORDER — CHLORHEXIDINE GLUCONATE 4 % EX LIQD: 60.0000 mL | Freq: Once | CUTANEOUS | Status: DC

## 2019-07-02 MED ORDER — LACTATED RINGERS IV SOLN
INTRAVENOUS | Status: DC
  Administered 2019-07-02 (×2): via INTRAVENOUS

## 2019-07-02 MED ORDER — BUPIVACAINE LIPOSOME 1.3 % IJ SUSP
INTRAMUSCULAR | Status: DC | PRN
  Administered 2019-07-02: 5 mL

## 2019-07-02 MED ORDER — HYDROCODONE-ACETAMINOPHEN 5-325 MG PO TABS: 1.0000 | ORAL_TABLET | Freq: Four times a day (QID) | ORAL | 0 refills | Status: AC | PRN

## 2019-07-02 MED ORDER — SCOPOLAMINE 1 MG/3DAYS TD PT72: 1.0000 | MEDICATED_PATCH | Freq: Once | TRANSDERMAL | Status: DC

## 2019-07-02 MED ORDER — MIDAZOLAM HCL 5 MG/5ML IJ SOLN
INTRAMUSCULAR | Status: DC | PRN
  Administered 2019-07-02: 2 mg via INTRAVENOUS

## 2019-07-02 MED ORDER — MIDAZOLAM HCL 2 MG/2ML IJ SOLN: 1.0000 mg | INTRAMUSCULAR | Status: DC | PRN

## 2019-07-02 MED ORDER — LIDOCAINE 2% (20 MG/ML) 5 ML SYRINGE
INTRAMUSCULAR | Status: AC
  Filled 2019-07-02: qty 15

## 2019-07-02 MED ORDER — KETOROLAC TROMETHAMINE 30 MG/ML IJ SOLN
30.0000 mg | Freq: Once | INTRAMUSCULAR | Status: AC | PRN
  Administered 2019-07-02: 30 mg via INTRAVENOUS

## 2019-07-02 MED ORDER — EPHEDRINE 5 MG/ML INJ
INTRAVENOUS | Status: AC
  Filled 2019-07-02: qty 20

## 2019-07-02 MED ORDER — PROPOFOL 10 MG/ML IV BOLUS
INTRAVENOUS | Status: AC
  Filled 2019-07-02: qty 20

## 2019-07-02 MED ORDER — MEPERIDINE HCL 25 MG/ML IJ SOLN: 6.2500 mg | INTRAMUSCULAR | Status: DC | PRN

## 2019-07-02 MED ORDER — OXYCODONE HCL 5 MG/5ML PO SOLN: 5.0000 mg | Freq: Once | ORAL | Status: AC | PRN

## 2019-07-02 SURGICAL SUPPLY — 73 items
ADH SKN CLS APL DERMABOND .7 (GAUZE/BANDAGES/DRESSINGS)
BLADE SURG 15 STRL LF DISP TIS (BLADE) ×1 IMPLANT
BLADE SURG 15 STRL SS (BLADE) ×3
BNDG CMPR 9X4 STRL LF SNTH (GAUZE/BANDAGES/DRESSINGS)
BNDG COHESIVE 4X5 TAN STRL (GAUZE/BANDAGES/DRESSINGS) IMPLANT
BNDG ELASTIC 3X5.8 VLCR STR LF (GAUZE/BANDAGES/DRESSINGS) ×3 IMPLANT
BNDG ELASTIC 4X5.8 VLCR STR LF (GAUZE/BANDAGES/DRESSINGS) IMPLANT
BNDG ELASTIC 6X5.8 VLCR STR LF (GAUZE/BANDAGES/DRESSINGS) IMPLANT
BNDG ESMARK 4X9 LF (GAUZE/BANDAGES/DRESSINGS) IMPLANT
BNDG GAUZE ELAST 4 BULKY (GAUZE/BANDAGES/DRESSINGS) IMPLANT
BOOT STEPPER DURA SM (SOFTGOODS) ×2 IMPLANT
CANISTER SUCT 1200ML W/VALVE (MISCELLANEOUS) ×2 IMPLANT
CLOSURE STERI-STRIP 1/2X4 (GAUZE/BANDAGES/DRESSINGS) ×1
CLSR STERI-STRIP ANTIMIC 1/2X4 (GAUZE/BANDAGES/DRESSINGS) ×1 IMPLANT
CORD BIPOLAR FORCEPS 12FT (ELECTRODE) IMPLANT
COVER BACK TABLE REUSABLE LG (DRAPES) ×3 IMPLANT
COVER MAYO STAND REUSABLE (DRAPES) ×3 IMPLANT
COVER WAND RF STERILE (DRAPES) IMPLANT
DECANTER SPIKE VIAL GLASS SM (MISCELLANEOUS) IMPLANT
DERMABOND ADVANCED (GAUZE/BANDAGES/DRESSINGS)
DERMABOND ADVANCED .7 DNX12 (GAUZE/BANDAGES/DRESSINGS) IMPLANT
DRAPE EXTREMITY T 121X128X90 (DISPOSABLE) ×3 IMPLANT
DRAPE INCISE IOBAN 66X45 STRL (DRAPES) IMPLANT
DRAPE OEC MINIVIEW 54X84 (DRAPES) ×2 IMPLANT
DRAPE SURG 17X23 STRL (DRAPES) ×3 IMPLANT
DRSG EMULSION OIL 3X3 NADH (GAUZE/BANDAGES/DRESSINGS) IMPLANT
DRSG TEGADERM 2-3/8X2-3/4 SM (GAUZE/BANDAGES/DRESSINGS) IMPLANT
DURAPREP 26ML APPLICATOR (WOUND CARE) ×3 IMPLANT
ELECT REM PT RETURN 9FT ADLT (ELECTROSURGICAL) ×3
ELECTRODE REM PT RTRN 9FT ADLT (ELECTROSURGICAL) ×1 IMPLANT
GAUZE SPONGE 4X4 12PLY STRL (GAUZE/BANDAGES/DRESSINGS) IMPLANT
GLOVE BIOGEL PI IND STRL 6.5 (GLOVE) IMPLANT
GLOVE BIOGEL PI IND STRL 8 (GLOVE) ×1 IMPLANT
GLOVE BIOGEL PI IND STRL 8.5 (GLOVE) ×1 IMPLANT
GLOVE BIOGEL PI INDICATOR 6.5 (GLOVE) ×2
GLOVE BIOGEL PI INDICATOR 8 (GLOVE) ×2
GLOVE BIOGEL PI INDICATOR 8.5 (GLOVE) ×4
GOWN STRL REUS W/ TWL LRG LVL3 (GOWN DISPOSABLE) ×1 IMPLANT
GOWN STRL REUS W/ TWL XL LVL3 (GOWN DISPOSABLE) ×1 IMPLANT
GOWN STRL REUS W/TWL LRG LVL3 (GOWN DISPOSABLE) ×6
GOWN STRL REUS W/TWL XL LVL3 (GOWN DISPOSABLE) ×3
NDL HYPO 25X1 1.5 SAFETY (NEEDLE) ×1 IMPLANT
NEEDLE HYPO 25X1 1.5 SAFETY (NEEDLE) ×3 IMPLANT
NS IRRIG 1000ML POUR BTL (IV SOLUTION) ×3 IMPLANT
PACK BASIN DAY SURGERY FS (CUSTOM PROCEDURE TRAY) ×3 IMPLANT
PAD CAST 3X4 CTTN HI CHSV (CAST SUPPLIES) ×1 IMPLANT
PAD CAST 4YDX4 CTTN HI CHSV (CAST SUPPLIES) IMPLANT
PADDING CAST ABS 3INX4YD NS (CAST SUPPLIES) ×2
PADDING CAST ABS COTTON 3X4 (CAST SUPPLIES) ×1 IMPLANT
PADDING CAST COTTON 3X4 STRL (CAST SUPPLIES) ×3
PADDING CAST COTTON 4X4 STRL (CAST SUPPLIES)
PENCIL BUTTON HOLSTER BLD 10FT (ELECTRODE) ×2 IMPLANT
SPONGE GAUZE 2X2 8PLY STER LF (GAUZE/BANDAGES/DRESSINGS) ×1
SPONGE GAUZE 2X2 8PLY STRL LF (GAUZE/BANDAGES/DRESSINGS) ×2 IMPLANT
STAPLER VISISTAT 35W (STAPLE) IMPLANT
STOCKINETTE 4X48 STRL (DRAPES) IMPLANT
STOCKINETTE 6  STRL (DRAPES) ×2
STOCKINETTE 6 STRL (DRAPES) IMPLANT
STOCKINETTE IMPERVIOUS LG (DRAPES) IMPLANT
SUCTION FRAZIER HANDLE 10FR (MISCELLANEOUS) ×2
SUCTION TUBE FRAZIER 10FR DISP (MISCELLANEOUS) IMPLANT
SUT ETHILON 3 0 PS 1 (SUTURE) ×2 IMPLANT
SUT ETHILON 4 0 PS 2 18 (SUTURE) IMPLANT
SUT VIC AB 0 CT1 27 (SUTURE)
SUT VIC AB 0 CT1 27XBRD ANBCTR (SUTURE) IMPLANT
SUT VIC AB 2-0 CT1 27 (SUTURE)
SUT VIC AB 2-0 CT1 TAPERPNT 27 (SUTURE) IMPLANT
SUT VIC AB 3-0 SH 27 (SUTURE) ×3
SUT VIC AB 3-0 SH 27X BRD (SUTURE) ×1 IMPLANT
SYR CONTROL 10ML LL (SYRINGE) ×2 IMPLANT
TUBE CONNECTING 20'X1/4 (TUBING) ×1
TUBE CONNECTING 20X1/4 (TUBING) ×1 IMPLANT
UNDERPAD 30X36 HEAVY ABSORB (UNDERPADS AND DIAPERS) ×3 IMPLANT

## 2019-07-02 NOTE — H&P (Signed)
PREOPERATIVE H&P  Chief Complaint: painful hardware left distal tibia  HPI: Christina Schroeder is a 63 y.o. female who presents for preoperative history and physical with a diagnosis of painful hardware left distal tibia. Symptoms are rated as moderate to severe, and have been worsening.  This is significantly impairing activities of daily living.  She has elected for surgical management.   Past Medical History:  Diagnosis Date  . Hypertension   . Hypothyroidism    Past Surgical History:  Procedure Laterality Date  . ABDOMINAL HYSTERECTOMY  2017  . CARPAL TUNNEL RELEASE Right 2014  . LUMBAR LAMINECTOMY     Laser Spine in [place]  . ORIF TIBIA FRACTURE  2008   Social History   Socioeconomic History  . Marital status: Married    Spouse name: Not on file  . Number of children: Not on file  . Years of education: Not on file  . Highest education level: Not on file  Occupational History  . Not on file  Social Needs  . Financial resource strain: Not on file  . Food insecurity    Worry: Not on file    Inability: Not on file  . Transportation needs    Medical: Not on file    Non-medical: Not on file  Tobacco Use  . Smoking status: Never Smoker  . Smokeless tobacco: Never Used  Substance and Sexual Activity  . Alcohol use: Yes    Comment: 4 days per week  . Drug use: Never  . Sexual activity: Not on file  Lifestyle  . Physical activity    Days per week: Not on file    Minutes per session: Not on file  . Stress: Not on file  Relationships  . Social Herbalist on phone: Not on file    Gets together: Not on file    Attends religious service: Not on file    Active member of club or organization: Not on file    Attends meetings of clubs or organizations: Not on file    Relationship status: Not on file  Other Topics Concern  . Not on file  Social History Narrative  . Not on file   History reviewed. No pertinent family history. Allergies  Allergen Reactions   . Sulfonamide Derivatives Nausea And Vomiting    Vomit after surgery   Prior to Admission medications   Medication Sig Start Date End Date Taking? Authorizing Provider  Calcium Carb-Cholecalciferol (CALCIUM CARBONATE-VITAMIN D3 PO) Take by mouth.   Yes [provider]  ENBREL SURECLICK 50 MG/ML injection 50 mg once a week.  05/03/19  Yes [provider]  hydrochlorothiazide (HYDRODIURIL) 25 MG tablet hydrochlorothiazide 25 mg tablet   Yes [provider]  levothyroxine (SYNTHROID, LEVOTHROID) 112 MCG tablet 1 daily. Pt must schedule a physical appointment before next refill. 04/25/11  Yes Panosh, Standley Brooking, MD  losartan (COZAAR) 100 MG tablet losartan 100 mg tablet   Yes [provider]  meloxicam (MOBIC) 15 MG tablet meloxicam 15 mg tablet   Yes [provider]  Misc Natural Products (GLUCOSAMINE CHOND COMPLEX/MSM) TABS Take by mouth.   Yes [provider]  traMADol (ULTRAM) 50 MG tablet tramadol 50 mg tablet  TAKE 1 TABLET BY MOUTH EVERY 6 HOURS AS NEEDED   Yes [provider]     Positive ROS: All other systems have been reviewed and were otherwise negative with the exception of those mentioned in the HPI and as above.  Physical  Exam: General: Alert, no acute distress Cardiovascular: No pedal edema Respiratory: No cyanosis, no use of accessory musculature GI: No organomegaly, abdomen is soft and non-tender Skin: No lesions in the area of chief complaint Neurologic: Sensation intact distally Psychiatric: Patient is competent for consent with normal mood and affect Lymphatic: No axillary or cervical lymphadenopathy  MUSCULOSKELETAL: Left leg with no significant deformity, she has mild left heel cord tightness. Left ankle and foot NV normal. Subjective numbness and paresthesias in the left dorsal mid foot and into the left middle 3 toes. Tender anterior left distal tibia distal to the incision scar for placement of anterior  distal tibia IM Nail interlocking screws.   Radiographs left tibia with retained IM Nail with 3 distal interlocking screws, 2 are medial to lateral and one is anterior to posterior.  Assessment: Painful hardware left distal tibia  Plan: Plan for Procedure(s): REMOVAL OF 3 LEFT TIBIA IM NAIL DISTAL INTERLOCKING SCREWS  The risks benefits and alternatives were discussed with the patient including but not limited to the risks of nonoperative treatment, versus surgical intervention including infection, bleeding, nerve injury,  blood clots, cardiopulmonary complications, morbidity, mortality, among others, and they were willing to proceed.   Vira BrownsJames Sarh Kirschenbaum, MD Cell [phone] Office [phone] 07/02/2019 12:18 PM

## 2019-07-02 NOTE — Discharge Instructions (Addendum)
Elevate left ankle as often as possible for the first week, then as swelling decreases may allow the foot to be  dependent for longer period.  Use the CAM walking brace when up or standing or walking. Place only 50% of weight on the left leg and ankle and foot. Due to the holes from the interlocking screws the bone is weak for about 6 weeks and prone to fracture so care fully decrease weight bearing is necessary to avoid injury.  Do not wet the right foot bandage or wound for 3-4 days then you may remove dressing and apply bandaids to the incision areas x 3..  If swelling, drainage or pain worsens then contact your physician/surgeon.  If fever >101.5 or chills or worsening fatigue or malaise contact your physician.  If blood sugars worsen or become more resistant to insulin use contact your physician.  If shortness of breath or coughing bloody sputum call 911 or proceed to the ER.   Post Anesthesia Home Care Instructions  Activity: Get plenty of rest for the remainder of the day. A responsible individual must stay with you for 24 hours following the procedure.  For the next 24 hours, DO NOT: -Drive a car -Paediatric nurse -Drink alcoholic beverages -Take any medication unless instructed by your physician -Make any legal decisions or sign important papers.  Meals: Start with liquid foods such as gelatin or soup. Progress to regular foods as tolerated. Avoid greasy, spicy, heavy foods. If nausea and/or vomiting occur, drink only clear liquids until the nausea and/or vomiting subsides. Call your physician if vomiting continues.  Special Instructions/Symptoms: Your throat may feel dry or sore from the anesthesia or the breathing tube placed in your throat during surgery. If this causes discomfort, gargle with warm salt water. The discomfort should disappear within 24 hours.  If you had a scopolamine patch placed behind your ear for the management of post- operative nausea and/or  vomiting:  1. The medication in the patch is effective for 72 hours, after which it should be removed.  Wrap patch in a tissue and discard in the trash. Wash hands thoroughly with soap and water. 2. You may remove the patch earlier than 72 hours if you experience unpleasant side effects which may include dry mouth, dizziness or visual disturbances. 3. Avoid touching the patch. Wash your hands with soap and water after contact with the patch.    Information for Discharge Teaching: EXPAREL (bupivacaine liposome injectable suspension)   Your surgeon or anesthesiologist gave you EXPAREL(bupivacaine) to help control your pain after surgery.   EXPAREL is a local anesthetic that provides pain relief by numbing the tissue around the surgical site.  EXPAREL is designed to release pain medication over time and can control pain for up to 72 hours.  Depending on how you respond to EXPAREL, you may require less pain medication during your recovery.  Possible side effects:  Temporary loss of sensation or ability to move in the area where bupivacaine was injected.  Nausea, vomiting, constipation  Rarely, numbness and tingling in your mouth or lips, lightheadedness, or anxiety may occur.  Call your doctor right away if you think you may be experiencing any of these sensations, or if you have other questions regarding possible side effects.  Follow all other discharge instructions given to you by your surgeon or nurse. Eat a healthy diet and drink plenty of water or other fluids.  If you return to the hospital for any reason within 96 hours  following the administration of EXPAREL, it is important for health care providers to know that you have received this anesthetic. A teal colored band has been placed on your arm with the date, time and amount of EXPAREL you have received in order to alert and inform your health care providers. Please leave this armband in place for the full 96 hours following  administration, and then you may remove the band.

## 2019-07-02 NOTE — Interval H&P Note (Signed)
History and Physical Interval Note:  07/02/2019 12:23 PM  Christina Schroeder  has presented today for surgery, with the diagnosis of painful hardware left distal tibia.  The various methods of treatment have been discussed with the patient and family. After consideration of risks, benefits and other options for treatment, the patient has consented to  Procedure(s): REMOVAL OF 3 LEFT TIBIA IM NAIL DISTAL INTERLOCKING SCREWS (Left) as a surgical intervention.  The patient's history has been reviewed, patient examined, no change in status, stable for surgery.  I have reviewed the patient's chart and labs.  Questions were answered to the patient's satisfaction.     [name]

## 2019-07-02 NOTE — OR Nursing (Signed)
3 interlocking screws removed by Dr. Louanne Skye.

## 2019-07-02 NOTE — Anesthesia Procedure Notes (Signed)
Procedure Name: LMA Insertion Date/Time: 07/02/2019 1:01 PM Performed by: Marrianne Mood, CRNA Pre-anesthesia Checklist: Patient identified, Emergency Drugs available, Suction available, Patient being monitored and Timeout performed Patient Re-evaluated:Patient Re-evaluated prior to induction Oxygen Delivery Method: Circle system utilized Preoxygenation: Pre-oxygenation with 100% oxygen Induction Type: IV induction Ventilation: Mask ventilation without difficulty LMA: LMA inserted LMA Size: 4.0 Number of attempts: 1 Airway Equipment and Method: Bite block Placement Confirmation: positive ETCO2 Tube secured with: Tape Dental Injury: Teeth and Oropharynx as per pre-operative assessment

## 2019-07-02 NOTE — Op Note (Signed)
07/02/2019  1:48 PM  PATIENT:  Christina Schroeder  63 y.o. female  MRN: [id number]  OPERATIVE REPORT  PRE-OPERATIVE DIAGNOSIS:  painful hardware left distal tibia  POST-OPERATIVE DIAGNOSIS:  painful hardware left distal tibia  PROCEDURE:  Procedure(s): REMOVAL OF 3 LEFT TIBIA IM NAIL DISTAL INTERLOCKING SCREWS    SURGEON:  Jessy Oto, MD     ASSISTANT:  Benjiman Core, PA-C  (Present throughout the entire procedure and necessary for completion of procedure in a timely manner)      ANESTHESIA:  General, supplemented with local marcaine 0.5% 1:1 exparel 1.3% total 10cc, Dr. Lissa Hoard.    COMPLICATIONS:  None.      PROCEDURE: The patient was met in the holding area, and the appropriate left ankle identified and marked with an "X" and my initials.   The patient was then transported to OR and was placed on the operative table in a supine position. The patient was then placed under general anesthesia without difficulty. The patient received appropriate preoperative antibiotic prophylaxis ancef 1 gram. Leg was then prepped using sterile conditions and draped using sterile technique. Time-out procedure was called and correct.   No tourniquet was used.  An incision was made over the anterior aspect of the left ankle directly through the old incision scar over a length of about 1 cm. Incision through skin and subcutaneous layers spread bluntly with hemostat down to anterior distal interlocking nail screw, the soft tissue bluntly elevated over the anterior screw and the screw head exposed, a medium hex screw driver then used to remove the screw. Next the medial two distal interlocking screws were removed. First an incision was made over the medial aspect of the left ankle directly through the old distal medial incision scar over a length of about 0.5 cm. Incision through skin and subcutaneous layers strictly down to medial distal most distal interlocking nail screw, the soft tissue  bluntly elevated over the screw head and the screw head exposed, a medium hex screw driver then used to remove the screw. An incision was made over the proximal of the two distal interlocking screws,  first using the sterile draped Mini C-Arm for localization and finding the previous small incision scar over the medial aspect of the left ankle. The incision about 4 mm in length directly through the old incision scar. Incision through skin and the subcutaneous layers spread bluntly down to proximal of the  Distal medial interlocking nail screws, the soft tissue elevated over the  Screw head and the screw head exposed, a medium hex screw driver then used to remove the screw.The incision was then closed approximating the deep layers with interrupted 0 Vicryl sutures more superficial layers with interrupted 2-0 Vicryl sutures and the skin closed with a single 3-0 nylon simple suture.  Mini C-arm brought into the field and left leg rotated to view the left distal tibia. All of the distal interlocking screws were removed, images obtained.The incision site areas were infiltrated subcutaneously with exparel 1.3 percent total of 10 mL. Adaptic 4 x 4's  Placed and then kerlex. Ace wrap was then applied to the left lower extremity ankle from the forefoot to the right upper leg. All instrument and sponge counts were correct. The removed retained hardware were 3 x 3.5 cortical screws from the left distal tibia. A CAM walking brace applied. The any C-arm was then used to document the distal structures of the ankle minus hardware. This documented the removal of hardware. Patient  was then reactivated and returned to recovery room in satisfactory condition all instrument and sponge counts were correct.  Separate stab incisions required nearly a half hours of time spent removing the screws with various universal hardware removal equipment. Normal expected time for this procedure would be one hour.      [name]  07/02/2019,  1:48 PM

## 2019-07-02 NOTE — OR Nursing (Signed)
3 screws placed in sterile container and taken to pacu for patient per Dr. Louanne Skye.

## 2019-07-02 NOTE — Brief Op Note (Signed)
07/02/2019  1:45 PM  PATIENT:  Christina Schroeder  63 y.o. female  PRE-OPERATIVE DIAGNOSIS:  painful hardware left distal tibia  POST-OPERATIVE DIAGNOSIS:  painful hardware left distal tibia  PROCEDURE:  Procedure(s): REMOVAL OF 3 LEFT TIBIA IM NAIL DISTAL INTERLOCKING SCREWS (Left)  SURGEON:  Surgeon(s) and Role:    * Jessy Oto, MD - Primary  PHYSICIAN ASSISTANT: Benjiman Core, PA-C   ANESTHESIA:   local and general, Dr. Lissa Hoard  EBL: 10cc  BLOOD ADMINISTERED:none  DRAINS: none   LOCAL MEDICATIONS USED:  MARCAINE 0.5% 1:1 EXPAREL 1.3% Amount: 10 ml  SPECIMEN:  No Specimen  DISPOSITION OF SPECIMEN:  N/A  COUNTS:  YES  TOURNIQUET:  * Missing tourniquet times found for documented tourniquets in log: [id number] *  DICTATION: .Dragon Dictation  PLAN OF CARE: Discharge to home after PACU  PATIENT DISPOSITION:  PACU - hemodynamically stable.   Delay start of Pharmacological VTE agent (>24hrs) due to surgical blood loss or risk of bleeding: yes

## 2019-07-02 NOTE — Anesthesia Postprocedure Evaluation (Signed)
Anesthesia Post Note  Patient: Christina Schroeder  Procedure(s) Performed: REMOVAL OF 3 LEFT TIBIA IM NAIL DISTAL INTERLOCKING SCREWS (Left Leg Lower)     Patient location during evaluation: PACU Anesthesia Type: General Level of consciousness: awake and alert and oriented Pain management: pain level controlled Vital Signs Assessment: post-procedure vital signs reviewed and stable Respiratory status: spontaneous breathing, nonlabored ventilation and respiratory function stable Cardiovascular status: blood pressure returned to baseline Postop Assessment: no apparent nausea or vomiting Anesthetic complications: no    Last Vitals:  Vitals:   07/02/19 1415 07/02/19 1430  BP:  (!) 149/92  Pulse: 74 69  Resp: (!) 22 15  Temp:    SpO2: 100% 97%               Brennan Bailey

## 2019-07-02 NOTE — Transfer of Care (Signed)
Immediate Anesthesia Transfer of Care Note  Patient: Christina Schroeder  Procedure(s) Performed: REMOVAL OF 3 LEFT TIBIA IM NAIL DISTAL INTERLOCKING SCREWS (Left Leg Lower)  Patient Location: PACU  Anesthesia Type:General  Level of Consciousness: awake and patient cooperative  Airway & Oxygen Therapy: Patient Spontanous Breathing and Patient connected to face mask oxygen  Post-op Assessment: Report given to RN and Post -op Vital signs reviewed and stable  Post vital signs: Reviewed and stable  Last Vitals:  Vitals Value Taken Time  BP    Temp    Pulse 80 07/02/19 1400  Resp 17 07/02/19 1400  SpO2 100 % 07/02/19 1400  Vitals shown include unvalidated device data.  Last Pain:  Vitals:   07/02/19 1022  TempSrc: Oral  PainSc: 0-No pain         Complications: No apparent anesthesia complications

## 2019-07-02 NOTE — Anesthesia Preprocedure Evaluation (Signed)
Anesthesia Evaluation  Patient identified by MRN, date of birth, ID band Patient awake    Reviewed: Allergy & Precautions, NPO status , Patient's Chart, lab work & pertinent test results  Airway Mallampati: II  TM Distance: >3 FB Neck ROM: Full    Dental  (+) Dental Advisory Given   Pulmonary neg pulmonary ROS,    Pulmonary exam normal breath sounds clear to auscultation       Cardiovascular hypertension, negative cardio ROS Normal cardiovascular exam Rhythm:Regular Rate:Normal     Neuro/Psych negative neurological ROS  negative psych ROS   GI/Hepatic negative GI ROS, Neg liver ROS,   Endo/Other  Hypothyroidism   Renal/GU negative Renal ROS     Musculoskeletal  (+) Arthritis ,   Abdominal   Peds negative pediatric ROS (+)  Hematology negative hematology ROS (+)   Anesthesia Other Findings   Reproductive/Obstetrics negative OB ROS                             Anesthesia Physical Anesthesia Plan  ASA: II  Anesthesia Plan: General   Post-op Pain Management:    Induction: Intravenous  PONV Risk Score and Plan:   Airway Management Planned: LMA  Additional Equipment: None  Intra-op Plan:   Post-operative Plan: Extubation in OR  Informed Consent: I have reviewed the patients History and Physical, chart, labs and discussed the procedure including the risks, benefits and alternatives for the proposed anesthesia with the patient or authorized representative who has indicated his/her understanding and acceptance.     Dental advisory given  Plan Discussed with: CRNA  Anesthesia Plan Comments:         Anesthesia Quick Evaluation

## 2019-07-04 ENCOUNTER — Encounter: Payer: Self-pay | Admitting: Specialist

## 2019-07-04 ENCOUNTER — Encounter (HOSPITAL_BASED_OUTPATIENT_CLINIC_OR_DEPARTMENT_OTHER): Payer: Self-pay | Admitting: Specialist

## 2019-07-05 ENCOUNTER — Telehealth: Payer: Self-pay | Admitting: Specialist

## 2019-07-05 NOTE — Telephone Encounter (Signed)
Patient called needing diag codes in order to complete her short term disability form. The number to contact patient is [phone]

## 2019-07-08 NOTE — Telephone Encounter (Signed)
Sent back to patient vis My Chart

## 2019-07-17 ENCOUNTER — Ambulatory Visit: Payer: Self-pay

## 2019-07-17 ENCOUNTER — Encounter: Payer: Self-pay | Admitting: Surgery

## 2019-07-17 ENCOUNTER — Ambulatory Visit (INDEPENDENT_AMBULATORY_CARE_PROVIDER_SITE_OTHER): Payer: BC Managed Care – PPO | Admitting: Surgery

## 2019-07-17 DIAGNOSIS — T8484XA Pain due to internal orthopedic prosthetic devices, implants and grafts, initial encounter: Secondary | ICD-10-CM

## 2019-07-17 NOTE — Progress Notes (Signed)
   Post-Op Visit Note   Patient: Christina Schroeder           Date of Birth: Dec 25, 1955           MRN: [id number] Visit Date: 07/17/2019 PCP: Reeves Forth, MD   Assessment & Plan:  Chief Complaint:  Chief Complaint  Patient presents with  . Left Lower Leg - Routine Post Op   Visit Diagnoses:  1. Painful orthopaedic hardware [facility])     Plan: Patient will remain in boot until at least 6 weeks postop.  I will have her 50% partial weight-bear in the boot with crutches for the next week and then she can attempt to go to full weightbearing in the boot with crutches for 2 more weeks until she sees Dr. Louanne Skye.  I stressed her the importance of being compliant with my instructions.  Understands the risk of reinjury to the distal tibia. She will continue out of work. Follow-Up Instructions: Return in about 3 weeks (around 08/07/2019) for With Dr. Louanne Skye recheck left ankle.   Orders:  Orders Placed This Encounter  Procedures  . XR Ankle 2 Views Left   No orders of the defined types were placed in this encounter.   Imaging: No results found.  PMFS History: Patient Active Problem List   Diagnosis Date Noted  . 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 Lumbosacral spondylosis without myelopathy 08/19/2015  . Wrist joint pain 10/30/2013  . Carpal tunnel syndrome 06/19/2013  . Personal history of cardiovascular disease 06/18/2013  . S/P laparoscopic hysterectomy 12/12/2012  . PSORIASIS, SCALP 05/03/2010  . BACK PAIN 04/09/2010  . EAR PAIN, RIGHT 04/24/2009  . BACK PAIN WITH RADICULOPATHY 04/15/2009  . LUMBAR STRAIN, ACUTE 02/23/2008  . HYPERLIPIDEMIA 07/18/2007  . HYPOTHYROIDISM 07/06/2007  . HYPERTENSION 07/06/2007  . Allergic rhinitis, cause unspecified 07/06/2007  . MENOPAUSAL SYNDROME 07/06/2007  . WEIGHT GAIN 07/06/2007   Past Medical History:  Diagnosis Date  . Hypertension   . Hypothyroidism     History reviewed. No pertinent family history.   Past Surgical History:  Procedure Laterality Date  . ABDOMINAL HYSTERECTOMY  2017  . CARPAL TUNNEL RELEASE Right 2014  . HARDWARE REMOVAL Left 07/02/2019   Procedure: REMOVAL OF 3 LEFT TIBIA IM NAIL DISTAL INTERLOCKING SCREWS;  Surgeon: Jessy Oto, MD;  Location: [place];  Service: Orthopedics;  Laterality: Left;  . LUMBAR LAMINECTOMY     Laser Spine in [place]  . ORIF TIBIA FRACTURE  2008   Social History   Occupational History  . Not on file  Tobacco Use  . Smoking status: Never Smoker  . Smokeless tobacco: Never Used  Substance and Sexual Activity  . Alcohol use: Yes    Comment: 4 days per week  . Drug use: Never  . Sexual activity: Not on file   Exam Surgical incisions well-healed.

## 2019-08-11 ENCOUNTER — Encounter: Payer: Self-pay | Admitting: Specialist

## 2019-08-15 ENCOUNTER — Encounter: Payer: Self-pay | Admitting: Specialist

## 2019-08-15 ENCOUNTER — Ambulatory Visit (INDEPENDENT_AMBULATORY_CARE_PROVIDER_SITE_OTHER): Payer: BC Managed Care – PPO | Admitting: Specialist

## 2019-08-15 ENCOUNTER — Other Ambulatory Visit: Payer: Self-pay

## 2019-08-15 ENCOUNTER — Ambulatory Visit (INDEPENDENT_AMBULATORY_CARE_PROVIDER_SITE_OTHER): Payer: BC Managed Care – PPO

## 2019-08-15 VITALS — BP 146/86 | HR 77 | Ht 63.0 in | Wt 149.0 lb

## 2019-08-15 DIAGNOSIS — T8484XA Pain due to internal orthopedic prosthetic devices, implants and grafts, initial encounter: Secondary | ICD-10-CM

## 2019-08-15 NOTE — Progress Notes (Signed)
   Post-Op Visit Note   Patient: Christina Schroeder           Date of Birth: 06-09-1956           MRN: [id number] Visit Date: 08/15/2019 PCP: Reeves Forth, MD   Assessment & Plan: 6 weeks post removal of hardware left distal leg and ankle 3 interlocking screws.   Chief Complaint:  Chief Complaint  Patient presents with  . Left Ankle - Routine Post Op  Post op 6 weeks post removal of hardware  Left ankle with stable ROM, DF is limited to 10 degrees. Inversion and eversion are normal. Incisions are healed. Radiographs post removal of hardware no abnormality.  Visit Diagnoses:  1. Painful orthopaedic hardware [facility])     Plan: Left leg full weight bearing without the left short leg brace. Walking up to 1/2 mile per day. Golf only 6-9 holes at a time for one month. After one month may return to normal activities. Strengthening exercises of the left ankle with a theraband.   Follow-Up Instructions: Return in about 6 weeks (around 09/26/2019).   Orders:  Orders Placed This Encounter  Procedures  . XR Ankle Complete Left   No orders of the defined types were placed in this encounter.   Imaging: Xr Ankle Complete Left  Result Date: 08/15/2019 AP lateral and oblique left ankle with increasing periscrew strengthening of the bone adjacent to the screws holes the left ankle without significant joint line narrowing. Marland Kitchen   PMFS History: Patient Active Problem List   Diagnosis Date Noted  . 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 Lumbosacral spondylosis without myelopathy 08/19/2015  . Wrist joint pain 10/30/2013  . Carpal tunnel syndrome 06/19/2013  . Personal history of cardiovascular disease 06/18/2013  . S/P laparoscopic hysterectomy 12/12/2012  . PSORIASIS, SCALP 05/03/2010  . BACK PAIN 04/09/2010  . EAR PAIN, RIGHT 04/24/2009  . BACK PAIN WITH RADICULOPATHY 04/15/2009  . LUMBAR STRAIN, ACUTE 02/23/2008  . HYPERLIPIDEMIA  07/18/2007  . HYPOTHYROIDISM 07/06/2007  . HYPERTENSION 07/06/2007  . Allergic rhinitis, cause unspecified 07/06/2007  . MENOPAUSAL SYNDROME 07/06/2007  . WEIGHT GAIN 07/06/2007   Past Medical History:  Diagnosis Date  . Hypertension   . Hypothyroidism     History reviewed. No pertinent family history.  Past Surgical History:  Procedure Laterality Date  . ABDOMINAL HYSTERECTOMY  2017  . CARPAL TUNNEL RELEASE Right 2014  . HARDWARE REMOVAL Left 07/02/2019   Procedure: REMOVAL OF 3 LEFT TIBIA IM NAIL DISTAL INTERLOCKING SCREWS;  Surgeon: Jessy Oto, MD;  Location: [place];  Service: Orthopedics;  Laterality: Left;  . LUMBAR LAMINECTOMY     Laser Spine in [place]  . ORIF TIBIA FRACTURE  2008   Social History   Occupational History  . Not on file  Tobacco Use  . Smoking status: Never Smoker  . Smokeless tobacco: Never Used  Substance and Sexual Activity  . Alcohol use: Yes    Comment: 4 days per week  . Drug use: Never  . Sexual activity: Not on file

## 2019-08-15 NOTE — Patient Instructions (Signed)
Plan: Left leg full weight bearing without the left short leg brace. Walking up to 1/2 mile per day. Golf only 6-9 holes at a time for one month. After one month may return to normal activities. Strengthening exercises of the left ankle with a theraband.    Ankle Exercises Ask your health care provider which exercises are safe for you. Do exercises exactly as told by your health care provider and adjust them as directed. It is normal to feel mild stretching, pulling, tightness, or mild discomfort as you do these exercises. Stop right away if you feel sudden pain or your pain gets worse. Do not begin these exercises until told by your health care provider. Stretching and range-of-motion exercises These exercises warm up your muscles and joints and improve the movement and flexibility of your ankle. These exercises may also help to relieve pain. Dorsiflexion/plantar flexion  1. Sit with your __________ knee straight or bent. Do not rest your foot on anything. 2. Flex your __________ ankle to tilt the top of your foot toward your shin. This is called dorsiflexion. 3. Hold this position for __________ seconds. 4. Point your toes downward to tilt the top of your foot away from your shin. This is called plantar flexion. 5. Hold this position for __________ seconds. Repeat __________ times. Complete this exercise __________ times a day. Ankle alphabet  1. Sit with your __________ foot supported at your lower leg. ? Do not rest your foot on anything. ? Make sure your foot has room to move freely. 2. Think of your __________ foot as a paintbrush: ? Move your foot to trace each letter of the alphabet in the air. Keep your hip and knee still while you trace the letters. Trace every letter from A to Z. ? Make the letters as large as you can without causing or increasing any discomfort. Repeat __________ times. Complete this exercise __________ times a day. Passive ankle dorsiflexion This is an  exercise in which something or someone moves your ankle for you. You do not move it yourself. 1. Sit on a chair that is placed on a non-carpeted surface. 2. Place your __________ foot on the floor, directly under your __________ knee. Extend your __________ leg for support. 3. Keeping your heel down, slide your __________ foot back toward the chair until you feel a stretch at your ankle or calf. If you do not feel a stretch, slide your buttocks forward to the edge of the chair while keeping your heel down. 4. Hold this stretch for __________ seconds. Repeat __________ times. Complete this exercise __________ times a day. Strengthening exercises These exercises build strength and endurance in your ankle. Endurance is the ability to use your muscles for a long time, even after they get tired. Dorsiflexors These are muscles that lift your foot up. 1. Secure a rubber exercise band or tube to an object, such as a table leg, that will stay still when the band is pulled. Secure the other end around your __________ foot. 2. Sit on the floor, facing the object with your __________ leg extended. The band or tube should be slightly tense when your foot is relaxed. 3. Slowly flex your __________ ankle and toes to bring your foot toward your shin. 4. Hold this position for __________ seconds. 5. Slowly return your foot to the starting position, controlling the band as you do that. Repeat __________ times. Complete this exercise __________ times a day. Plantar flexors These are muscles that push your foot down.  1. Sit on the floor with your __________ leg extended. 2. Loop a rubber exercise band or tube around the ball of your __________ foot. The ball of your foot is on the walking surface, right under your toes. The band or tube should be slightly tense when your foot is relaxed. 3. Slowly point your toes downward, pushing them away from you. 4. Hold this position for __________ seconds. 5. Slowly release  the tension in the band or tube, controlling smoothly until your foot is back in the starting position. Repeat __________ times. Complete this exercise __________ times a day. Towel curls  1. Sit in a chair on a non-carpeted surface, and put your feet on the floor. 2. Place a towel in front of your feet. If told by your health care provider, add a __________ pound weight to the end of the towel. 3. Keeping your heel on the floor, put your __________ foot on the towel. 4. Pull the towel toward you by grabbing the towel with your toes and curling them under. Keep your heel on the floor. 5. Let your toes relax. 6. Grab the towel again. Keep pulling the towel until it is completely underneath your foot. Repeat __________ times. Complete this exercise __________ times a day. Standing plantar flexion This is an exercise in which you use your toes to lift your body's weight while standing. 1. Stand with your feet shoulder-width apart. 2. Keep your weight spread evenly over the width of your feet while you rise up on your toes. Use a wall or table to steady yourself if needed, but try not to use it for support. 3. If this exercise is too easy, try these options: ? Shift your weight toward your __________ leg until you feel challenged. ? If told by your health care provider, lift your uninjured leg off the floor. 4. Hold this position for __________ seconds. Repeat __________ times. Complete this exercise __________ times a day. Tandem walking 1. Stand with one foot directly in front of the other. 2. Slowly raise your back foot up, lifting your heel before your toes, and place it directly in front of your other foot. 3. Continue to walk in this heel-to-toe way for __________ or for as long as told by your health care provider. Have a countertop or wall nearby to use if needed to keep your balance, but try not to hold onto anything for support. Repeat __________ times. Complete this exercise __________  times a day. This information is not intended to replace advice given to you by your health care provider. Make sure you discuss any questions you have with your health care provider. Document Released: 08/17/2005 Document Revised: 06/30/2018 Document Reviewed: 07/02/2018 Elsevier Patient Education  2020 [employer].

## 2019-08-23 ENCOUNTER — Other Ambulatory Visit: Payer: Self-pay | Admitting: Specialist

## 2019-08-27 ENCOUNTER — Other Ambulatory Visit: Payer: Self-pay | Admitting: Specialist

## 2019-08-30 ENCOUNTER — Encounter: Payer: Self-pay | Admitting: Specialist

## 2019-08-30 ENCOUNTER — Other Ambulatory Visit: Payer: Self-pay | Admitting: Radiology

## 2019-09-02 ENCOUNTER — Other Ambulatory Visit: Payer: Self-pay | Admitting: Specialist

## 2019-09-02 MED ORDER — TRAMADOL HCL 50 MG PO TABS: ORAL_TABLET | ORAL | 0 refills | Status: DC

## 2019-09-02 MED ORDER — TRAMADOL HCL 50 MG PO TABS: ORAL_TABLET | ORAL | 0 refills | Status: AC

## 2019-10-03 ENCOUNTER — Ambulatory Visit (INDEPENDENT_AMBULATORY_CARE_PROVIDER_SITE_OTHER): Payer: BC Managed Care – PPO | Admitting: Surgery

## 2019-10-03 ENCOUNTER — Other Ambulatory Visit: Payer: Self-pay

## 2019-10-03 ENCOUNTER — Encounter: Payer: Self-pay | Admitting: Surgery

## 2019-10-03 ENCOUNTER — Ambulatory Visit (INDEPENDENT_AMBULATORY_CARE_PROVIDER_SITE_OTHER): Payer: BC Managed Care – PPO

## 2019-10-03 VITALS — BP 131/80 | HR 74

## 2019-10-03 DIAGNOSIS — T8484XA Pain due to internal orthopedic prosthetic devices, implants and grafts, initial encounter: Secondary | ICD-10-CM | POA: Diagnosis not present

## 2019-10-03 NOTE — Progress Notes (Signed)
63 year old old female who is status post left ankle hardware removal returns.  States that she is doing well.  She is gradually stepping up her walking distances.  Continues have some soreness although this is improving.  She is walking up to a mile and a half.  She discontinued boot at last office visit.  Exam Left ankle she has good range of motion.  She does have some slight Achilles tightness.  Some soreness over the distal tibia although this not extreme.  No swelling or bruising.  Neurovascular intact.    Plan Advised patient that if she is up to a mile and a half and has some soreness after walk to decrease her distance some.  Continue icing off and on.  Will stretch before and after walking for exercise.  I think it is reasonable  to have her follow-up as needed.  X-rays today reviewed with patient.
